# Patient Record
Sex: Female | Born: 1975 | Race: White | Hispanic: No | Marital: Married | Smoking: Never smoker
Health system: Southern US, Community
[De-identification: ages and names within clinical notes are randomized; demographics above are authoritative.]

## PROBLEM LIST (undated history)

## (undated) DIAGNOSIS — F419 Anxiety disorder, unspecified: Secondary | ICD-10-CM

## (undated) DIAGNOSIS — F32A Depression, unspecified: Secondary | ICD-10-CM

## (undated) DIAGNOSIS — I10 Essential (primary) hypertension: Secondary | ICD-10-CM

## (undated) DIAGNOSIS — Z8742 Personal history of other diseases of the female genital tract: Secondary | ICD-10-CM

## (undated) DIAGNOSIS — E063 Autoimmune thyroiditis: Secondary | ICD-10-CM

## (undated) DIAGNOSIS — K219 Gastro-esophageal reflux disease without esophagitis: Secondary | ICD-10-CM

## (undated) DIAGNOSIS — D494 Neoplasm of unspecified behavior of bladder: Secondary | ICD-10-CM

## (undated) DIAGNOSIS — K589 Irritable bowel syndrome without diarrhea: Secondary | ICD-10-CM

## (undated) DIAGNOSIS — Z973 Presence of spectacles and contact lenses: Secondary | ICD-10-CM

## (undated) DIAGNOSIS — F329 Major depressive disorder, single episode, unspecified: Secondary | ICD-10-CM

## (undated) HISTORY — DX: Anxiety disorder, unspecified: F41.9

## (undated) HISTORY — PX: NO PAST SURGERIES: SHX2092

## (undated) HISTORY — DX: Irritable bowel syndrome, unspecified: K58.9

## (undated) HISTORY — DX: Essential (primary) hypertension: I10

---

## 2006-06-26 ENCOUNTER — Inpatient Hospital Stay (HOSPITAL_COMMUNITY): Admission: AD | Admit: 2006-06-26 | Discharge: 2006-06-29 | Payer: Self-pay | Admitting: Obstetrics & Gynecology

## 2011-07-20 ENCOUNTER — Encounter: Payer: Self-pay | Admitting: Internal Medicine

## 2011-07-20 ENCOUNTER — Ambulatory Visit (INDEPENDENT_AMBULATORY_CARE_PROVIDER_SITE_OTHER): Payer: BC Managed Care – PPO | Admitting: Internal Medicine

## 2011-07-20 VITALS — BP 132/82 | HR 85 | Temp 97.6°F | Resp 16 | Ht 64.5 in | Wt 156.0 lb

## 2011-07-20 DIAGNOSIS — F411 Generalized anxiety disorder: Secondary | ICD-10-CM

## 2011-07-20 DIAGNOSIS — K589 Irritable bowel syndrome without diarrhea: Secondary | ICD-10-CM

## 2011-07-20 DIAGNOSIS — I10 Essential (primary) hypertension: Secondary | ICD-10-CM

## 2011-07-20 DIAGNOSIS — F419 Anxiety disorder, unspecified: Secondary | ICD-10-CM

## 2011-07-20 LAB — CBC WITH DIFFERENTIAL/PLATELET
Eosinophils Absolute: 0.2 10*3/uL (ref 0.0–0.7)
Hemoglobin: 15.2 g/dL — ABNORMAL HIGH (ref 12.0–15.0)
Lymphocytes Relative: 22 % (ref 12–46)
Lymphs Abs: 2.1 10*3/uL (ref 0.7–4.0)
Monocytes Relative: 9 % (ref 3–12)
Neutro Abs: 6.2 10*3/uL (ref 1.7–7.7)
Neutrophils Relative %: 67 % (ref 43–77)
Platelets: 304 10*3/uL (ref 150–400)
RBC: 5.1 MIL/uL (ref 3.87–5.11)
WBC: 9.4 10*3/uL (ref 4.0–10.5)

## 2011-07-20 LAB — COMPREHENSIVE METABOLIC PANEL
ALT: 31 U/L (ref 0–35)
Albumin: 4.7 g/dL (ref 3.5–5.2)
CO2: 24 mEq/L (ref 19–32)
Chloride: 100 mEq/L (ref 96–112)
Glucose, Bld: 78 mg/dL (ref 70–99)
Potassium: 3.6 mEq/L (ref 3.5–5.3)
Sodium: 138 mEq/L (ref 135–145)
Total Bilirubin: 0.5 mg/dL (ref 0.3–1.2)
Total Protein: 7 g/dL (ref 6.0–8.3)

## 2011-07-20 LAB — TSH: TSH: 1.099 u[IU]/mL (ref 0.350–4.500)

## 2011-07-20 MED ORDER — CLONAZEPAM 0.5 MG PO TABS: 0.2500 mg | ORAL_TABLET | Freq: Two times a day (BID) | ORAL | Status: DC | PRN

## 2011-07-20 NOTE — Patient Instructions (Signed)
Take klonopin 1 tab daily.  OK to try 2 times per day if needed .  Can repeat in 12 hours  See me in  3 months  Make appt with Dr. Evelene Croon or Dr. Nolen Mu and Jeannie Done therapist

## 2011-07-20 NOTE — Progress Notes (Signed)
Subjective:    Patient ID: Jacqueline Calhoun, female    DOB: 1975/11/15, 35 y.o.   MRN: [id number]  HPI New pt here for first visit.  Former pt of [facility] Dr. Kevan Ny.    PMH of anxiety,  IBS,  HTN, and abnormal pap.    Anxiety first diagnosed in 2004 when living in [place]  .  Came off meds when pregnant.  Saw Dr. Kevan Ny in 3/12 and was given buspar which made her nauseated and was changed to Klonipin which she does not take everyday.  Anxiety described and "twitchy, irritable, cannot sleep"  At times will feel tight in chest.  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  Strong family history of anxiety in fathers side.  Has felt a little depressed at times in the last 2 weeks.  No suicidal or homicidal ideation, no psychotic features.  She does not have a therapist now  Was also diagnosed with HTN in march .  Placed on HCTZ and losartan was added 2 weeks later.  Occasionally has heavy periods that Dr. Seymour Bars was treating.  Apparently could not tolerate OC's  No Known Allergies Past Medical History  Diagnosis Date  . IBS (irritable bowel syndrome)   . Anxiety   . Hypertension    History reviewed. No pertinent past surgical history. History   Social History  . Marital Status: Married    Spouse Name: N/A    Number of Children: N/A  . Years of Education: N/A   Occupational History  . Not on file.   Social History Main Topics  . Smoking status: Never Smoker   . Smokeless tobacco: Never Used  . Alcohol Use: 1.2 oz/week    2 Shots of liquor per week  . Drug Use: No  . Sexually Active: Yes    Birth Control/ Protection: Condom   Other Topics Concern  . Not on file   Social History Narrative  . No narrative on file   Family History  Problem Relation Age of Onset  . Hypertension Mother   . Hypertension Father   . Heart disease Maternal Grandmother   . Heart disease Maternal Grandfather    Patient Active Problem List  Diagnoses    . Anxiety  . IBS (irritable bowel syndrome)  . Hypertension   No current outpatient prescriptions on file prior to visit.         Review of Systems    see HPI Objective:   Physical Exam Physical Exam  Nursing note and vitals reviewed.  Constitutional: She is oriented to person, place, and time. She appears well-developed and well-nourished.  HENT:  Head: Normocephalic and atraumatic.  Cardiovascular: Normal rate and regular rhythm. Exam reveals no gallop and no friction rub.  No murmur heard.  Pulmonary/Chest: Breath sounds normal. She has no wheezes. She has no rales.  Neurological: She is alert and oriented to person, place, and time.  Skin: Skin is warm and dry.  Psychiatric: She has a normal mood and affect. Her behavior is normal.         Assessment & Plan:  1)  Anxiety  Not under control.  Counseled to take Klonipin 1/2 tab daily and can repeat in 12 hours if needed.  OK to go to 1 tab daily if symptoms not controlled.  Will check labs, TSH today Refer to Dr. Evelene Croon or Dr. Nolen Mu -  Pt has number to call   Also gave number to [facility]  [place] for talking therapy  2)  HTN  Under good control 3)  IBS per pt history 4)  Mennorhagia  Dr. Seymour Bars following .  Will check CBC  I spent 45 minutes with this pt

## 2011-07-26 ENCOUNTER — Encounter: Payer: Self-pay | Admitting: Emergency Medicine

## 2011-07-28 ENCOUNTER — Telehealth: Payer: Self-pay | Admitting: Internal Medicine

## 2011-07-28 NOTE — Telephone Encounter (Signed)
Left message on MD voicemail at [facility] with medication as rx'd by DDS at [facility] 07/20/11

## 2011-07-28 NOTE — Telephone Encounter (Signed)
Pt calling about getting a refill on ClonazePAM (Tab) KLONOPIN 0.5 MG Take 0.5 tablets (0.25 mg total) by mouth 2 (two) times daily as needed for anxiety. Her pharmacy is at [facility] in [place] [address]. She states to call her if you have any questions at [phone]

## 2011-09-28 ENCOUNTER — Ambulatory Visit: Payer: BC Managed Care – PPO | Admitting: Internal Medicine

## 2011-11-16 ENCOUNTER — Ambulatory Visit (INDEPENDENT_AMBULATORY_CARE_PROVIDER_SITE_OTHER): Payer: BC Managed Care – PPO | Admitting: Internal Medicine

## 2011-11-16 ENCOUNTER — Encounter: Payer: Self-pay | Admitting: Internal Medicine

## 2011-11-16 VITALS — BP 130/80 | HR 88 | Temp 98.5°F | Resp 10 | Ht 64.5 in | Wt 162.0 lb

## 2011-11-16 DIAGNOSIS — F411 Generalized anxiety disorder: Secondary | ICD-10-CM

## 2011-11-16 DIAGNOSIS — J309 Allergic rhinitis, unspecified: Secondary | ICD-10-CM

## 2011-11-16 DIAGNOSIS — N926 Irregular menstruation, unspecified: Secondary | ICD-10-CM | POA: Insufficient documentation

## 2011-11-16 DIAGNOSIS — F419 Anxiety disorder, unspecified: Secondary | ICD-10-CM

## 2011-11-16 MED ORDER — CLONAZEPAM 0.5 MG PO TABS: 0.2500 mg | ORAL_TABLET | Freq: Two times a day (BID) | ORAL | Status: DC | PRN

## 2011-11-16 NOTE — Patient Instructions (Signed)
See me in 3 months  Make appt with Dr. Evelene Croon or Dr. Nolen Mu

## 2011-11-16 NOTE — Progress Notes (Signed)
Subjective:    Patient ID: Jacqueline Calhoun, female    DOB: Mar 28, 1976, 36 y.o.   MRN: [id number]  HPI  Jacqueline Calhoun is here for follow up.  She states Klonopin ishelping quite a bit.  Can concentrate, still early morning awakening.  Upon further quesitoning she did state she felt "blue" after birth of her 36 year old.  She has not made an appt with psychiatrist as yet.  No suicidal or homicidal ideation.  Some days feels depressed at times.  Some sneezing and nose bleed lately.  Feels itchy like allergies.  Still has some irregualr bleeding but hesitant to go on OC's  She has upcoming appt with Dr. Seymour Calhoun  No Known Allergies Past Medical History  Diagnosis Date  . IBS (irritable bowel syndrome)   . Anxiety   . Hypertension    No past surgical history on file. History   Social History  . Marital Status: Married    Spouse Name: N/A    Number of Children: N/A  . Years of Education: N/A   Occupational History  . Not on file.   Social History Main Topics  . Smoking status: Never Smoker   . Smokeless tobacco: Never Used  . Alcohol Use: 1.2 oz/week    2 Shots of liquor per week  . Drug Use: No  . Sexually Active: Yes    Birth Control/ Protection: Condom   Other Topics Concern  . Not on file   Social History Narrative  . No narrative on file   Family History  Problem Relation Age of Onset  . Hypertension Mother   . Hypertension Father   . Heart disease Maternal Grandmother   . Heart disease Maternal Grandfather    Patient Active Problem List  Diagnoses  . Anxiety  . IBS (irritable bowel syndrome)  . Hypertension   Current Outpatient Prescriptions on File Prior to Visit  Medication Sig Dispense Refill  . b complex vitamins tablet Take 1 tablet by mouth daily.        . calcium-vitamin D (OSCAL WITH D) 250-125 MG-UNIT per tablet Take 1 tablet by mouth daily.        . clonazePAM (KLONOPIN) 0.5 MG tablet Take 0.5 tablets (0.25 mg total) by mouth 2 (two) times daily as needed for  anxiety.  30 tablet  1  . hydrochlorothiazide (HYDRODIURIL) 25 MG tablet Take 1 tablet by mouth Daily.      Marland Kitchen losartan (COZAAR) 25 MG tablet Take 1 tablet by mouth Daily.      . Magnesium 250 MG TABS Take 1 tablet by mouth daily.        . Multiple Vitamin (MULTIVITAMIN) tablet Take 1 tablet by mouth daily.        . Probiotic Product (PROBIOTIC ACIDOPHILUS PO) Take 1 tablet by mouth daily.            Review of Systems    see HPI Objective:   Physical Exam   Physical Exam  Nursing note and vitals reviewed.  Constitutional: She is oriented to person, place, and time. She appears well-developed and well-nourished.  HENT:  Head: Normocephalic and atraumatic.  Cardiovascular: Normal rate and regular rhythm. Exam reveals no gallop and no friction rub.  No murmur heard.  Pulmonary/Chest: Breath sounds normal. She has no wheezes. She has no rales.  Neurological: She is alert and oriented to person, place, and time.  Skin: Skin is warm and dry.  Psychiatric: She has a normal mood and affect. Her behavior  is normal.            Assessment & Plan:  1)  Anxiety vs. 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  ADvised I will only give 2 more months of Klonopin and she is to make appt with psychiatrist .  She voices understanding 2)  Allergic rhinitis  Now that BP well conrolled ok to take Claritin or cClaritin D 3)  htn  Well controlled 4)  Irregular menses to discuss with Dr. Seymour Calhoun

## 2012-02-07 ENCOUNTER — Ambulatory Visit: Payer: BC Managed Care – PPO | Admitting: Internal Medicine

## 2012-02-14 ENCOUNTER — Ambulatory Visit: Payer: BC Managed Care – PPO | Admitting: Internal Medicine

## 2012-02-14 ENCOUNTER — Telehealth: Payer: Self-pay | Admitting: Internal Medicine

## 2012-02-29 ENCOUNTER — Ambulatory Visit: Payer: BC Managed Care – PPO | Admitting: Internal Medicine

## 2012-03-01 ENCOUNTER — Encounter: Payer: Self-pay | Admitting: Internal Medicine

## 2012-03-01 ENCOUNTER — Ambulatory Visit (INDEPENDENT_AMBULATORY_CARE_PROVIDER_SITE_OTHER): Payer: BC Managed Care – PPO | Admitting: Internal Medicine

## 2012-03-01 VITALS — BP 124/69 | HR 76 | Temp 98.2°F | Resp 16 | Ht 64.5 in | Wt 161.0 lb

## 2012-03-01 DIAGNOSIS — F419 Anxiety disorder, unspecified: Secondary | ICD-10-CM

## 2012-03-01 DIAGNOSIS — F411 Generalized anxiety disorder: Secondary | ICD-10-CM

## 2012-03-01 DIAGNOSIS — H6092 Unspecified otitis externa, left ear: Secondary | ICD-10-CM

## 2012-03-01 DIAGNOSIS — H60399 Other infective otitis externa, unspecified ear: Secondary | ICD-10-CM

## 2012-03-01 MED ORDER — OFLOXACIN 0.3 % OT SOLN: 5.0000 [drp] | Freq: Every day | OTIC | Status: AC

## 2012-03-03 ENCOUNTER — Encounter: Payer: Self-pay | Admitting: Internal Medicine

## 2012-03-03 DIAGNOSIS — H6092 Unspecified otitis externa, left ear: Secondary | ICD-10-CM | POA: Insufficient documentation

## 2012-03-03 NOTE — Progress Notes (Signed)
Subjective:    Patient ID: Jacqueline Calhoun, female    DOB: [birth year], 36 y.o.   MRN: [id number]  HPI Labrittany is here for follow up and has a new problem with her L ear.    She did see Dr. Evelene Croon and was placed on Prozac.  Kawana describes that Dr. Evelene Croon may have felt there was an OCD component to her recent behavior as Ciana was self harming by compulsively scratching her arms until they bled.   Since she has been on Prozac last 2-3 weeks, scratching behavior has ceased.  Tolerating meds well  She has pain in L ear, no fever no drainage.  She has been in the water lately  No Known Allergies Past Medical History  Diagnosis Date  . IBS (irritable bowel syndrome)   . Anxiety   . Hypertension    History reviewed. No pertinent past surgical history. History   Social History  . Marital Status: Married    Spouse Name: N/A    Number of Children: N/A  . Years of Education: N/A   Occupational History  . Not on file.   Social History Main Topics  . Smoking status: Never Smoker   . Smokeless tobacco: Never Used  . Alcohol Use: 1.2 oz/week    2 Shots of liquor per week  . Drug Use: No  . Sexually Active: Yes    Birth Control/ Protection: Condom   Other Topics Concern  . Not on file   Social History Narrative  . No narrative on file   Family History  Problem Relation Age of Onset  . Hypertension Mother   . Hypertension Father   . Heart disease Maternal Grandmother   . Heart disease Maternal Grandfather    Patient Active Problem List  Diagnosis  . Anxiety  . IBS (irritable bowel syndrome)  . Hypertension  . Allergic rhinitis  . Irregular menses   Current Outpatient Prescriptions on File Prior to Visit  Medication Sig Dispense Refill  . b complex vitamins tablet Take 1 tablet by mouth daily.        . calcium-vitamin D (OSCAL WITH D) 250-125 MG-UNIT per tablet Take 1 tablet by mouth daily.        . clonazePAM (KLONOPIN) 0.5 MG tablet Take 0.5 tablets (0.25 mg total) by mouth 2  (two) times daily as needed for anxiety.  30 tablet  1  . hydrochlorothiazide (HYDRODIURIL) 25 MG tablet Take 1 tablet by mouth Daily.      Marland Kitchen losartan (COZAAR) 25 MG tablet Take 1 tablet by mouth Daily.      . Magnesium 250 MG TABS Take 1 tablet by mouth daily.        . Multiple Vitamin (MULTIVITAMIN) tablet Take 1 tablet by mouth daily.        . Probiotic Product (PROBIOTIC ACIDOPHILUS PO) Take 1 tablet by mouth daily.        Marland Kitchen FLUoxetine (PROZAC) 10 MG capsule            Review of Systems    see HPI Objective:   Physical Exam  Physical Exam  Nursing note and vitals reviewed.  Constitutional: She is oriented to person, place, and time. She appears well-developed and well-nourished.  HENT:  Head: Normocephalic and atraumatic. TM's normal bilaterally.  R ear canal normal.  L canal red and swollen Cardiovascular: Normal rate and regular rhythm. Exam reveals no gallop and no friction rub.  No murmur heard.  Pulmonary/Chest: Breath sounds normal. She has  no wheezes. She has no rales.  Neurological: She is alert and oriented to person, place, and time.  Skin: Skin is warm and dry.  Psychiatric: She has a normal mood and affect. Her behavior is normal.         Assessment & Plan:  Anxiety/possible OCD  Continue with Dr. Evelene Croon, Prozac helping  L otiits externa.    Will give floxin otic   Return as needed

## 2012-03-23 ENCOUNTER — Other Ambulatory Visit: Payer: Self-pay | Admitting: Internal Medicine

## 2012-03-23 MED ORDER — LOSARTAN POTASSIUM 25 MG PO TABS: 25.0000 mg | ORAL_TABLET | Freq: Every day | ORAL | Status: DC

## 2012-03-23 MED ORDER — HYDROCHLOROTHIAZIDE 25 MG PO TABS: 25.0000 mg | ORAL_TABLET | Freq: Every day | ORAL | Status: DC

## 2012-03-23 NOTE — Telephone Encounter (Signed)
Pt need refills on Losartan Potassium (Tab) COZAAR 25 MG Take 1 tablet by mouth Daily     Hydrochlorothiazide (Tab) HYDRODIURIL 25 MG Take 1 tablet by mouth Daily.   She states she needs it sent to [facility] @ [place] off [address] If there are any questions please call pt at [phone]... Ad

## 2012-04-03 ENCOUNTER — Other Ambulatory Visit: Payer: Self-pay | Admitting: *Deleted

## 2012-04-03 DIAGNOSIS — I10 Essential (primary) hypertension: Secondary | ICD-10-CM

## 2012-04-03 MED ORDER — LOSARTAN POTASSIUM 25 MG PO TABS: 25.0000 mg | ORAL_TABLET | Freq: Every day | ORAL | Status: DC

## 2012-04-03 NOTE — Telephone Encounter (Signed)
[facility] faxed over RF authorization for pt's Losartan 25 mg.  RF granted per Dr Constance Goltz

## 2012-06-26 ENCOUNTER — Encounter: Payer: Self-pay | Admitting: Internal Medicine

## 2012-06-26 ENCOUNTER — Ambulatory Visit (INDEPENDENT_AMBULATORY_CARE_PROVIDER_SITE_OTHER): Payer: BC Managed Care – PPO | Admitting: Internal Medicine

## 2012-06-26 VITALS — BP 118/82 | HR 78 | Temp 97.6°F | Resp 18 | Wt 169.0 lb

## 2012-06-26 DIAGNOSIS — Z23 Encounter for immunization: Secondary | ICD-10-CM

## 2012-06-26 NOTE — Progress Notes (Signed)
  Subjective:    Patient ID: Jacqueline Calhoun, female    DOB: 12-11-1975, 36 y.o.   MRN: [id number]  HPI Error  Nurse visit for flu shot   Review of Systems     Objective:   Physical Exam        Assessment & Plan:

## 2012-07-06 ENCOUNTER — Ambulatory Visit (INDEPENDENT_AMBULATORY_CARE_PROVIDER_SITE_OTHER): Payer: BC Managed Care – PPO | Admitting: Physician Assistant

## 2012-07-06 VITALS — BP 122/70 | HR 67 | Temp 98.6°F | Resp 16 | Ht 64.75 in | Wt 172.0 lb

## 2012-07-06 DIAGNOSIS — J069 Acute upper respiratory infection, unspecified: Secondary | ICD-10-CM

## 2012-07-06 DIAGNOSIS — J029 Acute pharyngitis, unspecified: Secondary | ICD-10-CM

## 2012-07-06 LAB — POCT RAPID STREP A (OFFICE): Rapid Strep A Screen: NEGATIVE

## 2012-07-06 MED ORDER — FLUTICASONE PROPIONATE 50 MCG/ACT NA SUSP: 2.0000 | Freq: Every day | NASAL | Status: DC

## 2012-07-06 NOTE — Progress Notes (Signed)
   [address], [place] [address]   Phone [phone]  Subjective:    Patient ID: Jacqueline Calhoun, female    DOB: [birth year], 36 y.o.   MRN: [id number]  HPI Pt presents to clinic with 5 days h/o sore throat.  It has changed sides, it started on the L side and now on the R side.  Today she has developed some sinus congestion and HA.  She does not feel like she has PND.  She has seasonal allergies but this feels different b/c sore throat from PND typically goes away whereas this is not getting better.  She has used some tylenol which helps some.   Review of Systems  Constitutional: Negative for fever and chills.  HENT: Positive for congestion and sore throat. Negative for rhinorrhea and postnasal drip.   Respiratory: Negative for cough.   Neurological: Positive for headaches.       Objective:   Physical Exam  Vitals reviewed. Constitutional: She is oriented to person, place, and time. She appears well-developed and well-nourished.  HENT:  Head: Normocephalic and atraumatic.  Right Ear: Hearing, tympanic membrane, external ear and ear canal normal.  Left Ear: Hearing, tympanic membrane, external ear and ear canal normal.  Nose: Mucosal edema (pale) present.  Mouth/Throat: Uvula is midline.  Eyes: Conjunctivae normal are normal.  Neck: Neck supple.  Cardiovascular: Normal rate, regular rhythm and normal heart sounds.   No murmur heard. Pulmonary/Chest: Effort normal and breath sounds normal.  Lymphadenopathy:    She has no cervical adenopathy.  Neurological: She is alert and oriented to person, place, and time.  Skin: Skin is warm and dry.  Psychiatric: She has a normal mood and affect. Her behavior is normal. Judgment and thought content normal.   Results for orders placed in visit on 07/06/12  POCT RAPID STREP A (OFFICE)      Component Value Range   Rapid Strep A Screen Negative  Negative          Assessment & Plan:   1. Acute pharyngitis  POCT rapid strep A  2. URI  (upper respiratory infection)  fluticasone (FLONASE) 50 MCG/ACT nasal spray   I believe this is a viral illness with some PND from possible allergies due to pale and boggy turbinates.  I unfortunately expect pt to start having some rhinorrhea due to her congestion that started today.

## 2012-07-06 NOTE — Patient Instructions (Addendum)
Push fluids.  Tylenol and motrin for pain.  Use mucinex for congestion.

## 2012-07-24 ENCOUNTER — Other Ambulatory Visit: Payer: Self-pay | Admitting: *Deleted

## 2012-07-24 DIAGNOSIS — I10 Essential (primary) hypertension: Secondary | ICD-10-CM

## 2012-07-24 MED ORDER — HYDROCHLOROTHIAZIDE 25 MG PO TABS: 25.0000 mg | ORAL_TABLET | Freq: Every day | ORAL | Status: DC

## 2012-07-24 MED ORDER — LOSARTAN POTASSIUM 25 MG PO TABS: 25.0000 mg | ORAL_TABLET | Freq: Every day | ORAL | Status: DC

## 2012-07-24 NOTE — Telephone Encounter (Signed)
Jacqueline Calhoun  Call pt and let her know that I refilled 30 days worth of her BP meds.  She needs to see me in office and I need to recheck her labs on these meds.   Last labs done one year ago  thanks

## 2012-07-24 NOTE — Telephone Encounter (Signed)
Needs to be refilled 

## 2012-07-25 NOTE — Telephone Encounter (Signed)
Left message regarding [facility] and lab work

## 2012-08-01 ENCOUNTER — Telehealth: Payer: Self-pay | Admitting: *Deleted

## 2012-08-01 NOTE — Telephone Encounter (Signed)
Called pharmacy with rx HCTZ

## 2012-08-14 ENCOUNTER — Encounter (INDEPENDENT_AMBULATORY_CARE_PROVIDER_SITE_OTHER): Payer: BC Managed Care – PPO | Admitting: Internal Medicine

## 2012-08-14 LAB — LIPID PANEL
Cholesterol: 192 mg/dL (ref 0–200)
LDL Cholesterol: 113 mg/dL — ABNORMAL HIGH (ref 0–99)
Total CHOL/HDL Ratio: 3.4 Ratio
VLDL: 22 mg/dL (ref 0–40)

## 2012-08-14 LAB — COMPREHENSIVE METABOLIC PANEL
ALT: 25 U/L (ref 0–35)
AST: 22 U/L (ref 0–37)
Alkaline Phosphatase: 70 U/L (ref 39–117)
Creat: 0.83 mg/dL (ref 0.50–1.10)
Total Bilirubin: 0.5 mg/dL (ref 0.3–1.2)

## 2012-08-15 ENCOUNTER — Encounter: Payer: Self-pay | Admitting: *Deleted

## 2012-08-17 NOTE — Progress Notes (Signed)
  Subjective:    Patient ID: Jacqueline Calhoun, female    DOB: 11-Jun-1976, 36 y.o.   MRN: [id number]  HPI  Erroneous   Review of Systems     Objective:   Physical Exam        Assessment & Plan:

## 2012-08-24 ENCOUNTER — Encounter: Payer: Self-pay | Admitting: *Deleted

## 2012-09-29 ENCOUNTER — Other Ambulatory Visit: Payer: Self-pay | Admitting: Internal Medicine

## 2012-09-29 NOTE — Telephone Encounter (Signed)
Refill request

## 2012-09-30 MED ORDER — POTASSIUM CHLORIDE CRYS ER 20 MEQ PO TBCR: 20.0000 meq | EXTENDED_RELEASE_TABLET | Freq: Every day | ORAL | Status: DC

## 2012-09-30 NOTE — Telephone Encounter (Signed)
Jacqueline Calhoun  Call pt and let her know that the HCTZ she is on lowers her K level.  At her last lab check her K was at the lower end of normal range. I ordered two months of her HCTZ and a new K-dur RX  Counsel her to be sure to take a K Pill everyday with her HCTZ  And see me in office withing the next 60 days.     Message back with her appt  Date  Thanks

## 2012-10-30 ENCOUNTER — Ambulatory Visit (INDEPENDENT_AMBULATORY_CARE_PROVIDER_SITE_OTHER): Payer: BC Managed Care – PPO | Admitting: Internal Medicine

## 2012-10-30 ENCOUNTER — Encounter: Payer: Self-pay | Admitting: Internal Medicine

## 2012-10-30 VITALS — BP 112/74 | HR 68 | Temp 97.9°F | Resp 16 | Ht 65.0 in | Wt 171.0 lb

## 2012-10-30 DIAGNOSIS — I10 Essential (primary) hypertension: Secondary | ICD-10-CM

## 2012-10-30 DIAGNOSIS — R5383 Other fatigue: Secondary | ICD-10-CM

## 2012-10-30 DIAGNOSIS — F329 Major depressive disorder, single episode, unspecified: Secondary | ICD-10-CM

## 2012-10-30 DIAGNOSIS — R5381 Other malaise: Secondary | ICD-10-CM

## 2012-10-30 LAB — T4, FREE: Free T4: 1.13 ng/dL (ref 0.80–1.80)

## 2012-10-30 LAB — TSH: TSH: 1.353 u[IU]/mL (ref 0.350–4.500)

## 2012-10-30 NOTE — Patient Instructions (Addendum)
Call psychiatrist for appt  See me if any worsening symptoms

## 2012-10-30 NOTE — Progress Notes (Signed)
Subjective:    Patient ID: Jacqueline Calhoun, female    DOB: 09/20/1975, 37 y.o.   MRN: [id number]  HPI  [name] is here for acute visit. She is wondering if she has low thyroid.  Most female family members have low thyroid.  She has some weight gain no dry skin no hair loss  She also reports she stopped taking her Prozac as she did not like it.  She has not been back to see Dr. Evelene Croon but she does see Jerral Bonito her therapist regularly.  She describes herself as "stuck" emotionally. Wants to sleep a lot , feels tired all the time,  Depressed mood at times.  She denies S/H ideation  Just feels stuck.  She does not want to continue with Dr. Evelene Croon and was given a name of a psychaitrist by her therapist and she plans to call him.  Could not recall name.  She takes her Klonopin as needed only not regularly   No Known Allergies Past Medical History  Diagnosis Date  . IBS (irritable bowel syndrome)   . Anxiety   . Hypertension    History reviewed. No pertinent past surgical history. History   Social History  . Marital Status: Married    Spouse Name: N/A    Number of Children: N/A  . Years of Education: N/A   Occupational History  . Not on file.   Social History Main Topics  . Smoking status: Never Smoker   . Smokeless tobacco: Never Used  . Alcohol Use: 1.2 oz/week    2 Shots of liquor per week  . Drug Use: No  . Sexually Active: Yes    Birth Control/ Protection: Condom   Other Topics Concern  . Not on file   Social History Narrative  . No narrative on file   Family History  Problem Relation Age of Onset  . Hypertension Mother   . Hypertension Father   . Anxiety disorder Father   . Heart disease Maternal Grandmother   . Heart disease Maternal Grandfather    Patient Active Problem List  Diagnosis  . Anxiety  . IBS (irritable bowel syndrome)  . Hypertension  . Allergic rhinitis  . Irregular menses  . Left otitis externa  . Depression   Current Outpatient Prescriptions on  File Prior to Visit  Medication Sig Dispense Refill  . clonazePAM (KLONOPIN) 0.5 MG tablet Take 0.5 tablets (0.25 mg total) by mouth 2 (two) times daily as needed for anxiety.  30 tablet  1  . hydrochlorothiazide (HYDRODIURIL) 25 MG tablet TAKE 1 TABLET BY MOUTH DAILY  30 tablet  1  . losartan (COZAAR) 25 MG tablet Take 1 tablet (25 mg total) by mouth daily.  30 tablet  0  . Multiple Vitamin (MULTIVITAMIN) tablet Take 1 tablet by mouth daily.        . Probiotic Product (PROBIOTIC ACIDOPHILUS PO) Take 1 tablet by mouth daily.        . calcium-vitamin D (OSCAL WITH D) 250-125 MG-UNIT per tablet Take 1 tablet by mouth daily.        . fluticasone (FLONASE) 50 MCG/ACT nasal spray Place 2 sprays into the nose daily.  16 g  1  . Magnesium 250 MG TABS Take 1 tablet by mouth daily.        . potassium chloride SA (K-DUR,KLOR-CON) 20 MEQ tablet Take 1 tablet (20 mEq total) by mouth daily.  30 tablet  1   No current facility-administered medications on file prior to  visit.     Review of Systems    see HPI Objective:   Physical Exam Physical Exam  Nursing note and vitals reviewed.  Constitutional: She is oriented to person, place, and time. She appears well-developed and well-nourished.  HENT:  Head: Normocephalic and atraumatic.  Cardiovascular: Normal rate and regular rhythm. Exam reveals no gallop and no friction rub.  No murmur heard.  Pulmonary/Chest: Breath sounds normal. She has no wheezes. She has no rales.  Neurological: She is alert and oriented to person, place, and time.  Skin: Skin is warm and dry.  Psychiatric: She has a normal mood and affect. Her behavior is normal.             Assessment & Plan:  Anxiety/Depression:  I advised pt to try Wellbutrin as she does not like the SSRI's/SNRI's.  She declines at this time .  Encouraged to call psychiatrist for appt which she states she will do.  Continue with therapist Jerral Bonito.  Also counseled if worsening depression/anxiety  to either see me in office or  go to [facility] ER of choice and ask for behavioral assessment.    She is to see me in 4 weeks  HTN  Continue meds  Fatigue/weight gain   Will check TSH and free levels

## 2012-10-31 ENCOUNTER — Encounter: Payer: Self-pay | Admitting: *Deleted

## 2012-12-25 ENCOUNTER — Other Ambulatory Visit: Payer: Self-pay | Admitting: Internal Medicine

## 2012-12-25 NOTE — Telephone Encounter (Signed)
Refill request

## 2013-04-06 ENCOUNTER — Emergency Department (HOSPITAL_BASED_OUTPATIENT_CLINIC_OR_DEPARTMENT_OTHER): Payer: BC Managed Care – PPO

## 2013-04-06 ENCOUNTER — Emergency Department (HOSPITAL_BASED_OUTPATIENT_CLINIC_OR_DEPARTMENT_OTHER)
Admission: EM | Admit: 2013-04-06 | Discharge: 2013-04-06 | Disposition: A | Discharge status: Home / Self Care | Payer: BC Managed Care – PPO | Attending: Emergency Medicine | Admitting: Emergency Medicine

## 2013-04-06 ENCOUNTER — Encounter (HOSPITAL_BASED_OUTPATIENT_CLINIC_OR_DEPARTMENT_OTHER): Payer: Self-pay | Admitting: Family Medicine

## 2013-04-06 DIAGNOSIS — I10 Essential (primary) hypertension: Secondary | ICD-10-CM | POA: Insufficient documentation

## 2013-04-06 DIAGNOSIS — N83209 Unspecified ovarian cyst, unspecified side: Secondary | ICD-10-CM | POA: Insufficient documentation

## 2013-04-06 DIAGNOSIS — R110 Nausea: Secondary | ICD-10-CM | POA: Insufficient documentation

## 2013-04-06 DIAGNOSIS — R61 Generalized hyperhidrosis: Secondary | ICD-10-CM | POA: Insufficient documentation

## 2013-04-06 DIAGNOSIS — Z3202 Encounter for pregnancy test, result negative: Secondary | ICD-10-CM | POA: Insufficient documentation

## 2013-04-06 DIAGNOSIS — K589 Irritable bowel syndrome without diarrhea: Secondary | ICD-10-CM | POA: Insufficient documentation

## 2013-04-06 DIAGNOSIS — F411 Generalized anxiety disorder: Secondary | ICD-10-CM | POA: Insufficient documentation

## 2013-04-06 DIAGNOSIS — Z79899 Other long term (current) drug therapy: Secondary | ICD-10-CM | POA: Insufficient documentation

## 2013-04-06 LAB — CBC WITH DIFFERENTIAL/PLATELET
Hemoglobin: 14.5 g/dL (ref 12.0–15.0)
Lymphocytes Relative: 13 % (ref 12–46)
Lymphs Abs: 1.8 10*3/uL (ref 0.7–4.0)
Monocytes Relative: 7 % (ref 3–12)
Neutro Abs: 10.8 10*3/uL — ABNORMAL HIGH (ref 1.7–7.7)
Neutrophils Relative %: 79 % — ABNORMAL HIGH (ref 43–77)
Platelets: 273 10*3/uL (ref 150–400)
RBC: 4.96 MIL/uL (ref 3.87–5.11)
WBC: 13.7 10*3/uL — ABNORMAL HIGH (ref 4.0–10.5)

## 2013-04-06 LAB — COMPREHENSIVE METABOLIC PANEL
ALT: 35 U/L (ref 0–35)
Alkaline Phosphatase: 64 U/L (ref 39–117)
BUN: 11 mg/dL (ref 6–23)
Chloride: 98 mEq/L (ref 96–112)
GFR calc Af Amer: >90 mL/min (ref >90)
Glucose, Bld: 87 mg/dL (ref 70–99)
Potassium: 2.8 mEq/L — ABNORMAL LOW (ref 3.5–5.1)
Sodium: 138 mEq/L (ref 135–145)
Total Bilirubin: 0.5 mg/dL (ref 0.3–1.2)

## 2013-04-06 LAB — URINALYSIS, ROUTINE W REFLEX MICROSCOPIC
Glucose, UA: NEGATIVE mg/dL
Ketones, ur: NEGATIVE mg/dL
Specific Gravity, Urine: 1.015 (ref 1.005–1.030)
pH: 7.5 (ref 5.0–8.0)

## 2013-04-06 LAB — URINE MICROSCOPIC-ADD ON

## 2013-04-06 LAB — PREGNANCY, URINE: Preg Test, Ur: NEGATIVE

## 2013-04-06 LAB — LIPASE, BLOOD: Lipase: 42 U/L (ref 11–59)

## 2013-04-06 MED ORDER — IOHEXOL 300 MG/ML  SOLN
50.0000 mL | Freq: Once | INTRAMUSCULAR | Status: AC | PRN
  Administered 2013-04-06: 50 mL via ORAL

## 2013-04-06 MED ORDER — SODIUM CHLORIDE 0.9 % IV BOLUS (SEPSIS)
1000.0000 mL | Freq: Once | INTRAVENOUS | Status: AC
  Administered 2013-04-06: 1000 mL via INTRAVENOUS

## 2013-04-06 MED ORDER — ACETAMINOPHEN-CODEINE #3 300-30 MG PO TABS: 1.0000 | ORAL_TABLET | Freq: Four times a day (QID) | ORAL | Status: DC | PRN

## 2013-04-06 MED ORDER — IOHEXOL 300 MG/ML  SOLN
100.0000 mL | Freq: Once | INTRAMUSCULAR | Status: AC | PRN
  Administered 2013-04-06: 100 mL via INTRAVENOUS

## 2013-04-06 MED ORDER — POTASSIUM CHLORIDE CRYS ER 20 MEQ PO TBCR
40.0000 meq | EXTENDED_RELEASE_TABLET | Freq: Once | ORAL | Status: AC
  Administered 2013-04-06: 40 meq via ORAL
  Filled 2013-04-06: qty 2

## 2013-04-06 NOTE — ED Notes (Signed)
Flushed left AC IV with 10ml NS per patient request - IV was irritating her arm.  Pt states IV feels "better" after NS flush.  Dressing clean, dry, intact and no signs of infection noted at site.  Awaiting U/S study results and MD reevaluation.

## 2013-04-06 NOTE — ED Notes (Signed)
Pt c/o lower abdominal pain since this morning. Reports normal bowel movement today, c/o some nausea no vomiting. Pt sts she has had negative [place] at [facility] for abdomen pain recently but sts today's pain is different.

## 2013-04-06 NOTE — ED Provider Notes (Signed)
CSN: [id number]     Arrival date & time 04/06/13  1227 History     First MD Initiated Contact with Patient 04/06/13 1240     Chief Complaint  Patient presents with  . Abdominal Pain   (Consider location/radiation/quality/duration/timing/severity/associated sxs/prior Treatment) Patient is a 37 y.o. female presenting with abdominal pain.  Abdominal Pain  Pt with recent history of intermittent L sided abdominal and pelvic pain reports she has had moderate aching R lower abdominal pain since having BM earlier this morning associated with nausea, diaphoresis but no vomiting or fever. No diarrhea. No dysuria or hematuria. She has pelvic US about 2 weeks ago due to her left sided symptoms looking for fibroids or ovarian cysts but by her report it was neg. She has not had any right sided symptoms until today. She states her Gyn told her the next step would be laparoscopy to eval endometriosis. Pt has not had any vaginal bleeding or discharge.  Past Medical History  Diagnosis Date  . IBS (irritable bowel syndrome)   . Anxiety   . Hypertension    History reviewed. No pertinent past surgical history. Family History  Problem Relation Age of Onset  . Hypertension Mother   . Hypertension Father   . Anxiety disorder Father   . Heart disease Maternal Grandmother   . Heart disease Maternal Grandfather    History  Substance Use Topics  . Smoking status: Never Smoker   . Smokeless tobacco: Never Used  . Alcohol Use: 1.2 oz/week    2 Shots of liquor per week   OB History   Grav Para Term Preterm Abortions TAB SAB Ect Mult Living   1 1             Review of Systems  Gastrointestinal: Positive for abdominal pain.   All other systems reviewed and are negative except as noted in HPI.   Allergies  Review of patient's allergies indicates no known allergies.  Home Medications   Current Outpatient Rx  Name  Route  Sig  Dispense  Refill  . calcium-vitamin D (OSCAL WITH D) 250-125 MG-UNIT per  tablet   Oral   Take 1 tablet by mouth daily.           . clonazePAM (KLONOPIN) 0.5 MG tablet   Oral   Take 0.5 tablets (0.25 mg total) by mouth 2 (two) times daily as needed for anxiety.   30 tablet   1   . fluticasone (FLONASE) 50 MCG/ACT nasal spray   Nasal   Place 2 sprays into the nose daily.   16 g   1   . hydrochlorothiazide (HYDRODIURIL) 25 MG tablet      TAKE 1 TABLET BY MOUTH EVERY DAY   30 tablet   2   . losartan (COZAAR) 25 MG tablet   Oral   Take 1 tablet (25 mg total) by mouth daily.   30 tablet   0   . Magnesium 250 MG TABS   Oral   Take 1 tablet by mouth daily.           . Multiple Vitamin (MULTIVITAMIN) tablet   Oral   Take 1 tablet by mouth daily.           . potassium chloride SA (K-DUR,KLOR-CON) 20 MEQ tablet   Oral   Take 1 tablet (20 mEq total) by mouth daily.   30 tablet   1   . Probiotic Product (PROBIOTIC ACIDOPHILUS PO)   Oral  Take 1 tablet by mouth daily.            BP 152/97  Pulse 99  Temp(Src) 98.6 F (37 C) (Oral)  Resp 20  Ht 5\' 5"  (1.651 m)  Wt 170 lb (77.111 kg)  BMI 28.29 kg/m2  SpO2 99%  LMP 03/21/2013 Physical Exam  Nursing note and vitals reviewed. Constitutional: She is oriented to person, place, and time. She appears well-developed and well-nourished.  HENT:  Head: Normocephalic and atraumatic.  Eyes: EOM are normal. Pupils are equal, round, and reactive to light.  Neck: Normal range of motion. Neck supple.  Cardiovascular: Normal rate, normal heart sounds and intact distal pulses.   Pulmonary/Chest: Effort normal and breath sounds normal.  Abdominal: Bowel sounds are normal. She exhibits no distension. There is tenderness (RLQ). There is no rebound and no guarding.  Musculoskeletal: Normal range of motion. She exhibits no edema and no tenderness.  Neurological: She is alert and oriented to person, place, and time. She has normal strength. No cranial nerve deficit or sensory deficit.  Skin: Skin is  warm and dry. No rash noted.  Psychiatric: She has a normal mood and affect.    ED Course   Procedures (including critical care time)  Labs Reviewed  URINALYSIS, ROUTINE W REFLEX MICROSCOPIC - Abnormal; Notable for the following:    APPearance CLOUDY (*)    Leukocytes, UA MODERATE (*)    All other components within normal limits  CBC WITH DIFFERENTIAL - Abnormal; Notable for the following:    WBC 13.7 (*)    Neutrophils Relative % 79 (*)    Neutro Abs 10.8 (*)    All other components within normal limits  COMPREHENSIVE METABOLIC PANEL - Abnormal; Notable for the following:    Potassium 2.8 (*)    All other components within normal limits  URINE MICROSCOPIC-ADD ON - Abnormal; Notable for the following:    Squamous Epithelial / LPF MANY (*)    Bacteria, UA FEW (*)    All other components within normal limits  URINE CULTURE  PREGNANCY, URINE  LIPASE, BLOOD   Ct Abdomen Pelvis W Contrast  04/06/2013   *RADIOLOGY REPORT*  Clinical Data: Right lower quadrant pain  CT ABDOMEN AND PELVIS WITH CONTRAST  Technique:  Multidetector CT imaging of the abdomen and pelvis was performed following the standard protocol during bolus administration of intravenous contrast.  Contrast: 50mL OMNIPAQUE IOHEXOL 300 MG/ML  SOLN, OMNIPAQUE IOHEXOL 300 MG/ML  SOLN  Comparison: None.  Findings: The cecum is floppy and positioned in the right upper quadrant.  The appendix is within normal limits and indicated with arrows.  Prominent stool burden in the descending and sigmoid colon.  There is a small amount of free fluid layering in the pelvis.  Left adnexa is unremarkable.  There is a complex 4.6 x 2.5 cm solid and cystic abnormality within the right adnexa.  The bladder and prostate are within normal limits.  The thoracolumbar and lumbosacral junctions are transitional. Degenerative disc disease in the lower lumbar spine is present.  4 mm nodule in the lingula on image two.  Severe diffuse hepatic steatosis.   The gallbladder, spleen, pancreas, adrenal glands, kidneys are within normal limits.  No free intraperitoneal gas.  No obvious retroperitoneal adenopathy.  IMPRESSION: Normal appendix.  Complex solid and cystic abnormality in the right adnexa.  This may simply represent a large cystic abnormality of the ovary.  Other conditions such as hydrosalpinx should be considered.  Pelvic ultrasound  is recommended to further characterize.  Prominent stool burden in the distal colon.  Small amount of free fluid in the pelvis  4 mm left upper lobe nodule. If the patient is at high risk for bronchogenic carcinoma, follow-up chest CT at 1 year is recommended.  If the patient is at low risk, no follow-up is needed.  This recommendation follows the consensus statement: Guidelines for Management of Small Pulmonary Nodules Detected on CT Scans:  A Statement from the Fleischner Society as published in Radiology 2005; 237:395-400.  Degenerative disc disease in the lower lumbar spine.   Original Report Authenticated By: Jolaine Click, M.D.   No diagnosis found.  MDM  CT images reviewed. Sent for [place]. Care signed out to Dr. Rhunette Croft at shift change  Leonette Most B. Bernette Mayers, MD 04/06/13 [phone]

## 2013-04-07 LAB — URINE CULTURE: Colony Count: 40000

## 2013-04-09 ENCOUNTER — Telehealth: Payer: Self-pay | Admitting: Internal Medicine

## 2013-04-09 NOTE — Telephone Encounter (Signed)
Jacqueline Calhoun  Call this pt and ask if she The ER MD referrred her to her GYN after her ER visit.  If she was not referred , schedule OV with me  30 mins  thanks

## 2013-04-10 NOTE — Telephone Encounter (Signed)
LVM message for pt to return call  

## 2013-04-13 ENCOUNTER — Encounter: Payer: Self-pay | Admitting: Internal Medicine

## 2013-04-16 ENCOUNTER — Telehealth: Payer: Self-pay | Admitting: *Deleted

## 2013-04-16 NOTE — Telephone Encounter (Signed)
LVM message to return call  

## 2013-05-10 ENCOUNTER — Other Ambulatory Visit: Payer: Self-pay | Admitting: Internal Medicine

## 2013-05-10 NOTE — Telephone Encounter (Signed)
Refill request

## 2013-05-15 ENCOUNTER — Ambulatory Visit (INDEPENDENT_AMBULATORY_CARE_PROVIDER_SITE_OTHER): Payer: BC Managed Care – PPO | Admitting: Internal Medicine

## 2013-05-15 ENCOUNTER — Ambulatory Visit (HOSPITAL_BASED_OUTPATIENT_CLINIC_OR_DEPARTMENT_OTHER)
Admission: RE | Admit: 2013-05-15 | Discharge: 2013-05-15 | Disposition: A | Discharge status: Home / Self Care | Payer: BC Managed Care – PPO | Source: Ambulatory Visit | Attending: Internal Medicine | Admitting: Internal Medicine

## 2013-05-15 ENCOUNTER — Encounter: Payer: Self-pay | Admitting: Internal Medicine

## 2013-05-15 VITALS — BP 134/90 | HR 67 | Temp 97.0°F | Resp 18 | Wt 176.0 lb

## 2013-05-15 DIAGNOSIS — R911 Solitary pulmonary nodule: Secondary | ICD-10-CM | POA: Insufficient documentation

## 2013-05-15 DIAGNOSIS — I10 Essential (primary) hypertension: Secondary | ICD-10-CM

## 2013-05-15 DIAGNOSIS — F419 Anxiety disorder, unspecified: Secondary | ICD-10-CM

## 2013-05-15 DIAGNOSIS — K7689 Other specified diseases of liver: Secondary | ICD-10-CM | POA: Insufficient documentation

## 2013-05-15 DIAGNOSIS — K760 Fatty (change of) liver, not elsewhere classified: Secondary | ICD-10-CM | POA: Insufficient documentation

## 2013-05-15 DIAGNOSIS — N83209 Unspecified ovarian cyst, unspecified side: Secondary | ICD-10-CM

## 2013-05-15 DIAGNOSIS — F411 Generalized anxiety disorder: Secondary | ICD-10-CM

## 2013-05-15 DIAGNOSIS — Z23 Encounter for immunization: Secondary | ICD-10-CM

## 2013-05-15 LAB — CBC WITH DIFFERENTIAL/PLATELET
Basophils Absolute: 0.1 10*3/uL (ref 0.0–0.1)
Basophils Relative: 1 % (ref 0–1)
Eosinophils Absolute: 0.3 10*3/uL (ref 0.0–0.7)
HCT: 42 % (ref 36.0–46.0)
Hemoglobin: 14.2 g/dL (ref 12.0–15.0)
MCH: 28.6 pg (ref 26.0–34.0)
MCHC: 33.8 g/dL (ref 30.0–36.0)
Monocytes Absolute: 0.7 10*3/uL (ref 0.1–1.0)
Monocytes Relative: 8 % (ref 3–12)
Neutro Abs: 6.2 10*3/uL (ref 1.7–7.7)
RDW: 13.2 % (ref 11.5–15.5)

## 2013-05-15 MED ORDER — LOSARTAN POTASSIUM 50 MG PO TABS: 50.0000 mg | ORAL_TABLET | Freq: Every day | ORAL | Status: DC

## 2013-05-15 MED ORDER — FLUOXETINE HCL 10 MG PO CAPS: 10.0000 mg | ORAL_CAPSULE | Freq: Every day | ORAL | Status: DC

## 2013-05-15 NOTE — Progress Notes (Signed)
Subjective:    Patient ID: Jacqueline Calhoun, female    DOB: 08-06-1976, 37 y.o.   MRN: [id number]  HPI  Jacqueline Calhoun is here for ER follow up of 8/8.  We have communicated with her to schedule visit prior to this but this is the first time I have seen her  See ER visit.  Found to have ovarian cyst which is being followed by Dr. Seymour Bars - she has appt next week for follow up ultrasound  Found to have 4 mm lingular lung nodule on Ct  She doe snot smoke but lots of second hand smoke  Also noted to have hepatic steatosis  K 2.8 in ER  Pt reports she was not taking her K as the pill was hard to swallow.  She has stopped taking her HCTZ as well.     She also complains of insomnia.    She does not sleep well at night and nods of during th eday  She is on Prozac for her anxiety She would like to see a different psychiatrist.  I have referred her to Dr. Nolen Mu in the past but she did not make appt  No Known Allergies Past Medical History  Diagnosis Date  . IBS (irritable bowel syndrome)   . Anxiety   . Hypertension    History reviewed. No pertinent past surgical history. History   Social History  . Marital Status: Married    Spouse Name: N/A    Number of Children: N/A  . Years of Education: N/A   Occupational History  . Not on file.   Social History Main Topics  . Smoking status: Never Smoker   . Smokeless tobacco: Never Used  . Alcohol Use: 1.2 oz/week    2 Shots of liquor per week  . Drug Use: No  . Sexual Activity: Yes    Birth Control/ Protection: Condom   Other Topics Concern  . Not on file   Social History Narrative  . No narrative on file   Family History  Problem Relation Age of Onset  . Hypertension Mother   . Hypertension Father   . Anxiety disorder Father   . Heart disease Maternal Grandmother   . Heart disease Maternal Grandfather    Patient Active Problem List   Diagnosis Date Noted  . Ovarian cyst 05/15/2013  . Depression 10/30/2012  . Left otitis externa  03/03/2012  . Allergic rhinitis 11/16/2011  . Irregular menses 11/16/2011  . Anxiety   . IBS (irritable bowel syndrome)   . Hypertension    Current Outpatient Prescriptions on File Prior to Visit  Medication Sig Dispense Refill  . calcium-vitamin D (OSCAL WITH D) 250-125 MG-UNIT per tablet Take 1 tablet by mouth daily.        Marland Kitchen losartan (COZAAR) 25 MG tablet Take 1 tablet (25 mg total) by mouth daily.  30 tablet  0  . Magnesium 250 MG TABS Take 1 tablet by mouth daily.        . Multiple Vitamin (MULTIVITAMIN) tablet Take 1 tablet by mouth daily.        . Probiotic Product (PROBIOTIC ACIDOPHILUS PO) Take 1 tablet by mouth daily.        . clonazePAM (KLONOPIN) 0.5 MG tablet Take 0.5 tablets (0.25 mg total) by mouth 2 (two) times daily as needed for anxiety.  30 tablet  1  . fluticasone (FLONASE) 50 MCG/ACT nasal spray Place 2 sprays into the nose daily.  16 g  1  . hydrochlorothiazide (HYDRODIURIL)  25 MG tablet TAKE 1 TABLET BY MOUTH EVERY DAY  30 tablet  2  . potassium chloride SA (K-DUR,KLOR-CON) 20 MEQ tablet Take 1 tablet (20 mEq total) by mouth daily.  30 tablet  1   No current facility-administered medications on file prior to visit.       Review of Systems    see HPI Objective:   Physical Exam Physical Exam  Nursing note and vitals reviewed.  Constitutional: She is oriented to person, place, and time. She appears well-developed and well-nourished.  HENT:  Head: Normocephalic and atraumatic.  Cardiovascular: Normal rate and regular rhythm. Exam reveals no gallop and no friction rub.  No murmur heard.  Pulmonary/Chest: Breath sounds normal. She has no wheezes. She has no rales.  Neurological: She is alert and oriented to person, place, and time.  Skin: Skin is warm and dry.  Psychiatric: She has a normal mood and affect. Her behavior is normal.             Assessment & Plan:  Lunular lung nodule  Will get chest CT    Hypokalemia  Will check today  HTN  Will  increase Cozaar to 50 mg daily.  She is to see me in 3 months  Hepatic steatosis  Insomnia  Will let her psychiatrist manage this.  Suspect due to uncontrolled psychiatric symptoms.    Ovarian cyst  manaaged GYN  See me in 3 months,  Flu vaccine today

## 2013-05-15 NOTE — Patient Instructions (Addendum)
Call Dr. Nolen Mu for apt  [phone]  See me in 3 months

## 2013-05-15 NOTE — Addendum Note (Signed)
Addended by: Mathews Robinsons on: 05/15/2013 11:37 AM   Modules accepted: Orders

## 2013-05-16 ENCOUNTER — Encounter: Payer: Self-pay | Admitting: *Deleted

## 2013-05-17 ENCOUNTER — Encounter: Payer: Self-pay | Admitting: Internal Medicine

## 2013-05-17 ENCOUNTER — Telehealth: Payer: Self-pay | Admitting: *Deleted

## 2013-05-17 NOTE — Telephone Encounter (Signed)
Message copied by Mathews Robinsons on Thu May 17, 2013  3:19 PM ------      Message from: Raechel Chute D      Created: Thu May 17, 2013 11:38 AM       Karen Kitchens            Call [facility] and let her know that it looks like as best as can be reviewed that nodule is a lymph node - it is smaller in size.  Very reassuring  Ok to mail a copy of Ct to her if she wishes.        ------

## 2013-05-17 NOTE — Telephone Encounter (Signed)
Notified pt of Ct results 

## 2013-06-22 ENCOUNTER — Encounter: Payer: Self-pay | Admitting: Internal Medicine

## 2013-08-07 ENCOUNTER — Encounter: Payer: Self-pay | Admitting: Internal Medicine

## 2013-08-07 ENCOUNTER — Ambulatory Visit (INDEPENDENT_AMBULATORY_CARE_PROVIDER_SITE_OTHER): Payer: BC Managed Care – PPO | Admitting: Internal Medicine

## 2013-08-07 VITALS — BP 138/86 | HR 89 | Temp 96.7°F | Resp 18 | Wt 177.0 lb

## 2013-08-07 DIAGNOSIS — K760 Fatty (change of) liver, not elsewhere classified: Secondary | ICD-10-CM

## 2013-08-07 DIAGNOSIS — F411 Generalized anxiety disorder: Secondary | ICD-10-CM

## 2013-08-07 DIAGNOSIS — F329 Major depressive disorder, single episode, unspecified: Secondary | ICD-10-CM

## 2013-08-07 DIAGNOSIS — E876 Hypokalemia: Secondary | ICD-10-CM

## 2013-08-07 DIAGNOSIS — K7689 Other specified diseases of liver: Secondary | ICD-10-CM

## 2013-08-07 DIAGNOSIS — J029 Acute pharyngitis, unspecified: Secondary | ICD-10-CM

## 2013-08-07 DIAGNOSIS — F419 Anxiety disorder, unspecified: Secondary | ICD-10-CM

## 2013-08-07 MED ORDER — AZITHROMYCIN 250 MG PO TABS: ORAL_TABLET | ORAL | Status: DC

## 2013-08-07 NOTE — Progress Notes (Signed)
Subjective:    Patient ID: Jacqueline Calhoun, female    DOB: 04-Jul-1976, 37 y.o.   MRN: [id number]  HPI  Jacqueline Calhoun is here to follow up on her HTN,    Mixed anxiety and depression ,  And hepatic steatosis   She had a low K in August and for some reason the lab did not run this test.    She has had two weeks of sore throat  No fever  Rare cough  No chest pain  She works as a Runner, broadcasting/film/video  She tells me she is taking her Cozaar and she has stopped  Her HCTZ.  She is not taking any Klonopin but is taking her Prozac.   She has not made an appt with a psychiatrist as I have advised her to do on several visits  She has  Been placed on low dose OC's by her GYN but is now taking progesterone creme  OTC  She is hoping it would help with her fatigue and libido  No Known Allergies Past Medical History  Diagnosis Date  . IBS (irritable bowel syndrome)   . Anxiety   . Hypertension    History reviewed. No pertinent past surgical history. History   Social History  . Marital Status: Married    Spouse Name: N/A    Number of Children: N/A  . Years of Education: N/A   Occupational History  . Not on file.   Social History Main Topics  . Smoking status: Never Smoker   . Smokeless tobacco: Never Used  . Alcohol Use: 1.2 oz/week    2 Shots of liquor per week  . Drug Use: No  . Sexual Activity: Yes    Birth Control/ Protection: Condom   Other Topics Concern  . Not on file   Social History Narrative  . No narrative on file   Family History  Problem Relation Age of Onset  . Hypertension Mother   . Hypertension Father   . Anxiety disorder Father   . Heart disease Maternal Grandmother   . Heart disease Maternal Grandfather    Patient Active Problem List   Diagnosis Date Noted  . Ovarian cyst 05/15/2013  . Lung nodule seen on imaging study 05/15/2013  . Hepatic steatosis 05/15/2013  . Depression 10/30/2012  . Left otitis externa 03/03/2012  . Allergic rhinitis 11/16/2011  . Irregular menses  11/16/2011  . Anxiety   . IBS (irritable bowel syndrome)   . Hypertension    Current Outpatient Prescriptions on File Prior to Visit  Medication Sig Dispense Refill  . B Complex-C (B-COMPLEX WITH VITAMIN C) tablet Take 1 tablet by mouth daily.      . calcium-vitamin D (OSCAL WITH D) 250-125 MG-UNIT per tablet Take 1 tablet by mouth daily.        Marland Kitchen FLUoxetine (PROZAC) 10 MG capsule Take 1 capsule (10 mg total) by mouth daily.  90 capsule  0  . losartan (COZAAR) 50 MG tablet Take 1 tablet (50 mg total) by mouth daily.  90 tablet  3  . Magnesium 250 MG TABS Take 1 tablet by mouth daily.        . Multiple Vitamin (MULTIVITAMIN) tablet Take 1 tablet by mouth daily.        . Probiotic Product (PROBIOTIC ACIDOPHILUS PO) Take 1 tablet by mouth daily.        . clonazePAM (KLONOPIN) 0.5 MG tablet Take 0.5 tablets (0.25 mg total) by mouth 2 (two) times daily as needed for  anxiety.  30 tablet  1  . fluticasone (FLONASE) 50 MCG/ACT nasal spray Place 2 sprays into the nose daily.  16 g  1  . hydrochlorothiazide (HYDRODIURIL) 25 MG tablet TAKE 1 TABLET BY MOUTH EVERY DAY  30 tablet  2  . potassium chloride SA (K-DUR,KLOR-CON) 20 MEQ tablet Take 1 tablet (20 mEq total) by mouth daily.  30 tablet  1   No current facility-administered medications on file prior to visit.      Review of Systems     Objective:   Physical Exam Physical Exam  Nursing note and vitals reviewed.  Constitutional: She is oriented to person, place, and time. She appears well-developed and well-nourished.  HENT:  Head: Normocephalic and atraumatic.  O/P  Erythematous tonsills  No exudates  No cervical adenopathy Cardiovascular: Normal rate and regular rhythm. Exam reveals no gallop and no friction rub.  No murmur heard.  Pulmonary/Chest: Breath sounds normal. She has no wheezes. She has no rales.  Neurological: She is alert and oriented to person, place, and time.  Skin: Skin is warm and dry.  Psychiatric: She has a normal  mood and affect. Her behavior is normal.             Assessment & Plan:  HTN  Continue Cozaar  LOw libido:  Advised that addition of OTC progesterone creme unlikely to help libido or energy  Mixed anxiety/depression  I again advised pt that some of her symptoms  (fatigue, low energy, insomnia)  may be helped with appropriate psychiatric care.  She has been given names of local psychiatrists several times  Insomnia  I offered referral for sleep evaluation but pt declined    Heaptic steatosis  Will check lfts today  Pharyngitis  Likely viral.  I gave RX for z-pack if no improvement in 5 days she may take this  Hypokalemia in past will check today

## 2013-08-07 NOTE — Patient Instructions (Signed)
Labs today  If you continue to  have trouble sleeping call office for referral

## 2013-08-08 ENCOUNTER — Encounter: Payer: Self-pay | Admitting: *Deleted

## 2013-08-08 ENCOUNTER — Telehealth: Payer: Self-pay | Admitting: *Deleted

## 2013-08-08 LAB — COMPREHENSIVE METABOLIC PANEL
AST: 36 U/L (ref 0–37)
Albumin: 4.1 g/dL (ref 3.5–5.2)
Alkaline Phosphatase: 68 U/L (ref 39–117)
BUN: 16 mg/dL (ref 6–23)
Creat: 0.73 mg/dL (ref 0.50–1.10)
Glucose, Bld: 79 mg/dL (ref 70–99)
Total Bilirubin: 0.4 mg/dL (ref 0.3–1.2)

## 2013-08-08 NOTE — Telephone Encounter (Signed)
Called Jodene about lab work and to schedule a follow up appt.  Left message on her home VM for her to call us back.

## 2013-08-13 ENCOUNTER — Ambulatory Visit: Payer: BC Managed Care – PPO | Admitting: Internal Medicine

## 2013-09-04 ENCOUNTER — Ambulatory Visit (INDEPENDENT_AMBULATORY_CARE_PROVIDER_SITE_OTHER): Payer: BC Managed Care – PPO | Admitting: Internal Medicine

## 2013-09-04 ENCOUNTER — Encounter: Payer: Self-pay | Admitting: Internal Medicine

## 2013-09-04 ENCOUNTER — Other Ambulatory Visit: Payer: Self-pay | Admitting: Internal Medicine

## 2013-09-04 VITALS — BP 128/85 | HR 79 | Temp 98.1°F | Resp 16 | Wt 176.0 lb

## 2013-09-04 DIAGNOSIS — R945 Abnormal results of liver function studies: Principal | ICD-10-CM

## 2013-09-04 DIAGNOSIS — R7989 Other specified abnormal findings of blood chemistry: Secondary | ICD-10-CM

## 2013-09-04 DIAGNOSIS — K760 Fatty (change of) liver, not elsewhere classified: Secondary | ICD-10-CM

## 2013-09-04 DIAGNOSIS — K7689 Other specified diseases of liver: Secondary | ICD-10-CM

## 2013-09-04 LAB — ACUTE HEP PANEL AND HEP B SURFACE AB
HCV Ab: NEGATIVE
HEP B S AG: NEGATIVE
Hep A IgM: NONREACTIVE
Hep B C IgM: NONREACTIVE
Hep B S Ab: NEGATIVE

## 2013-09-04 LAB — HEPATITIS PANEL, ACUTE
HCV AB: NEGATIVE
HEP B C IGM: NONREACTIVE
Hep A IgM: NONREACTIVE
Hepatitis B Surface Ag: NEGATIVE

## 2013-09-04 NOTE — Progress Notes (Signed)
Subjective:    Patient ID: Jacqueline Calhoun, female    DOB: Apr 08, 1976, 38 y.o.   MRN: [id number]  HPI  Jacqueline Calhoun is here for follow up she has minimally elevated ALT and known hepatic steatosis.  She denies ETOH ingestion or hepatitis exposure.  NO OTC herbs or supplements  See CT  She did see Dr. Dellis Filbert for her ovarian cyst and states she did have follow up ultrasound in her office  No Known Allergies Past Medical History  Diagnosis Date  . IBS (irritable bowel syndrome)   . Anxiety   . Hypertension    History reviewed. No pertinent past surgical history. History   Social History  . Marital Status: Married    Spouse Name: N/A    Number of Children: N/A  . Years of Education: N/A   Occupational History  . Not on file.   Social History Main Topics  . Smoking status: Never Smoker   . Smokeless tobacco: Never Used  . Alcohol Use: 1.2 oz/week    2 Shots of liquor per week  . Drug Use: No  . Sexual Activity: Yes    Birth Control/ Protection: Condom   Other Topics Concern  . Not on file   Social History Narrative  . No narrative on file   Family History  Problem Relation Age of Onset  . Hypertension Mother   . Hypertension Father   . Anxiety disorder Father   . Heart disease Maternal Grandmother   . Heart disease Maternal Grandfather    Patient Active Problem List   Diagnosis Date Noted  . Ovarian cyst 05/15/2013  . Lung nodule seen on imaging study 05/15/2013  . Hepatic steatosis 05/15/2013  . Depression 10/30/2012  . Left otitis externa 03/03/2012  . Allergic rhinitis 11/16/2011  . Irregular menses 11/16/2011  . Anxiety   . IBS (irritable bowel syndrome)   . Hypertension    Current Outpatient Prescriptions on File Prior to Visit  Medication Sig Dispense Refill  . B Complex-C (B-COMPLEX WITH VITAMIN C) tablet Take 1 tablet by mouth daily.      . calcium-vitamin D (OSCAL WITH D) 250-125 MG-UNIT per tablet Take 1 tablet by mouth daily.        Marland Kitchen losartan  (COZAAR) 50 MG tablet Take 1 tablet (50 mg total) by mouth daily.  90 tablet  3  . Magnesium 250 MG TABS Take 1 tablet by mouth daily.        . Multiple Vitamin (MULTIVITAMIN) tablet Take 1 tablet by mouth daily.        . Probiotic Product (PROBIOTIC ACIDOPHILUS PO) Take 1 tablet by mouth daily.        Marland Kitchen FLUoxetine (PROZAC) 10 MG capsule Take 1 capsule (10 mg total) by mouth daily.  90 capsule  0  . fluticasone (FLONASE) 50 MCG/ACT nasal spray Place 2 sprays into the nose daily.  16 g  1   No current facility-administered medications on file prior to visit.      Review of Systems See HPI    Objective:   Physical Exam Physical Exam  Nursing note and vitals reviewed.  Constitutional: She is oriented to person, place, and time. She appears well-developed and well-nourished.  HENT:  Head: Normocephalic and atraumatic.  Cardiovascular: Normal rate and regular rhythm. Exam reveals no gallop and no friction rub.  No murmur heard.  Pulmonary/Chest: Breath sounds normal. She has no wheezes. She has no rales. Abd:  BS+  Soft no tenderness in  any quadrant  No HSM  Neurological: She is alert and oriented to person, place, and time.  Skin: Skin is warm and dry.  Psychiatric: She has a normal mood and affect. Her behavior is normal.        Assessment & Plan:  Elevation of ALT with known hepatic steatosis  Will check hepatitis panel and ANA today.  ADvised to avoid keep ETOH to one per 24 hours and no OTC herbs and supplemets  Ovarian cyst  Followed by gyn.  Heapatic steatosis    See me in 4-6 months as needed

## 2013-09-05 ENCOUNTER — Encounter: Payer: Self-pay | Admitting: Internal Medicine

## 2013-09-05 LAB — ANA: Anti Nuclear Antibody(ANA): NEGATIVE

## 2013-09-14 ENCOUNTER — Encounter: Payer: Self-pay | Admitting: Internal Medicine

## 2013-09-17 ENCOUNTER — Other Ambulatory Visit: Payer: Self-pay | Admitting: *Deleted

## 2013-09-17 MED ORDER — FLUOXETINE HCL 10 MG PO CAPS: 10.0000 mg | ORAL_CAPSULE | Freq: Every day | ORAL | Status: DC

## 2013-09-17 NOTE — Telephone Encounter (Signed)
Pt has an appt with Letta Moynahan on 2/16 would like refill until then

## 2013-12-20 ENCOUNTER — Ambulatory Visit: Payer: BC Managed Care – PPO

## 2014-03-06 ENCOUNTER — Ambulatory Visit: Payer: BC Managed Care – PPO | Admitting: Internal Medicine

## 2014-03-22 ENCOUNTER — Other Ambulatory Visit: Payer: Self-pay | Admitting: Internal Medicine

## 2014-03-25 ENCOUNTER — Telehealth: Payer: Self-pay | Admitting: *Deleted

## 2014-03-25 ENCOUNTER — Other Ambulatory Visit: Payer: Self-pay | Admitting: *Deleted

## 2014-03-25 MED ORDER — LOSARTAN POTASSIUM 50 MG PO TABS: 50.0000 mg | ORAL_TABLET | Freq: Every day | ORAL | Status: DC

## 2014-03-25 NOTE — Telephone Encounter (Signed)
Request sent 

## 2014-03-25 NOTE — Telephone Encounter (Signed)
Sent refill request

## 2014-03-25 NOTE — Telephone Encounter (Signed)
Evey, Mcmahan - 03/22/2014 11:10 AM ','<More Detail >>       Rx Auth Request    Surescripts Out Interface        Sent: Fri March 22, 2014 11:10 AM    To: [facility] Clinical Pool                   Message     ----- Message from [place] sent at 03/22/2014 11:10 AM -----         The demographic information from the pharmacy is:    Patient Name: [facility]    Patient DOB: 1976-05-21    Patient Gender: Female    Address:    [place]    [facility]    [phone]             Amalee Olsen MRN: [id number] Home: [phone]  38 y.o. / Female (1976/01/30) PCP: Lanice Shirts, MD Work: [phone]  Pharmacy: [id number] - [place], [address] Ph: [phone] Wt: 176 lb (79.833 kg) (09/04/2013) Mobile: [phone]          Guarantor Account: Loel Ro ([id number])     Relation to Patient: Account Type Service Area    Self Personal/Family [place] for This Account     Coverage ID Payor Plan Insurance ID     [place] [id number]             Guarantor Account: Elayah, [place] ([id number])     Relation to Patient: Account Type Service Area    Spouse Personal/Family [place] for This Account     Coverage ID Payor Plan Insurance ID     [id number] [place] [id number]             Guarantor Account: Larna, [place] ([id number])     Relation to Patient: Account Type Service Area    Self Personal/Family GAAM-GAAIM [facility]                   Requested Medications     Medication name:  Name from pharmacy:  losartan (COZAAR) 50 MG tablet  LOSARTAN 50MG  TABLETS    Sig: TAKE 1 TABLET BY MOUTH DAILY    Dispense: 90 tablet Refills: 0 Start: 03/22/2014  Class:  Normal    Requested on: 03/22/2014    Originally ordered on: 05/15/2013 Last refill: 03/22/2014 Order History and Details              Call Documentation     No notes of this type exist for this encounter.             Contacts       Type Contact Phone    03/22/2014 11:10 AM Interface (Incoming) [facility] [phone]              Allergies as of 03/22/2014 Review Complete On: 09/04/2013 By: Marcial Pacas, RN     No Known Allergies             Patient Flags     No FYI flags for this patient.  Outstanding Procedures      Open Future Orders       Priority Expected Expires Ordered    CT Chest Wo Contrast Routine  07/15/2014 05/15/2013           Normal Orders       Priority Ordered    Comprehensive metabolic panel Routine 12/06/1446              Past Appointments     Date Time Status Provider Dept Type Appt Notes    03/06/2014 8:15 AM Can Lanice Shirts, MD [facility] ESTPT30 Called on 6 different days and left messages about needing to reschedule appt./ No return call/ 6 month follow up/ hb    12/20/2013 11:45 AM Left [facility] ([facility]) [facility] OV PATIENT LEFT NOT SEEN  EAR ACHE, GO    09/04/2013 8:30 AM Comp Lanice Shirts, MD [facility] ESTPT30 follow up on labs/ hb    08/13/2013 8:15 AM Can Lanice Shirts, MD [facility] ESTPT30 3 month follow up/ hb    08/07/2013 11:15 AM Comp Lanice Shirts, MD [facility] ESTPT30 3 month follow up/ hb    05/15/2013 9:25 AM Comp [facility]Ct 1 [facility]-CT CTCHEST Pre auth # [id number]    05/15/2013 8:15 AM Comp Lanice Shirts, MD [place] ESTPT30 F/U er visit    04/06/2013 3:10 PM Comp Mhp-Us 1 MHP-US USNONOBTV     04/06/2013 1:10 PM Comp [facility]-Ct 1 MHP-CT CTBODYW     10/30/2012 8:45 AM Comp Lanice Shirts, MD [facility] ESTPT15 general questions...f/u.Marland Kitchenad               Pharmacy     [id number] - [place], [address] AT [facility] [address]    [place] [address]    Phone: [phone] Fax: [phone]    Open 24 Hours?: No          Refill request

## 2014-03-25 NOTE — Telephone Encounter (Signed)
Refill request

## 2014-05-20 ENCOUNTER — Telehealth: Payer: Self-pay | Admitting: *Deleted

## 2014-05-20 NOTE — Telephone Encounter (Signed)
I spoke with Bethanne Ginger and let her know that her labs came back normal. I also mailed her a copy of the labs-eh

## 2014-06-04 ENCOUNTER — Encounter: Payer: Self-pay | Admitting: Internal Medicine

## 2014-06-04 ENCOUNTER — Ambulatory Visit (INDEPENDENT_AMBULATORY_CARE_PROVIDER_SITE_OTHER): Payer: BC Managed Care – PPO | Admitting: Internal Medicine

## 2014-06-04 ENCOUNTER — Ambulatory Visit (HOSPITAL_BASED_OUTPATIENT_CLINIC_OR_DEPARTMENT_OTHER)
Admission: RE | Admit: 2014-06-04 | Discharge: 2014-06-04 | Disposition: A | Discharge status: Home / Self Care | Payer: BC Managed Care – PPO | Source: Ambulatory Visit | Attending: Internal Medicine | Admitting: Internal Medicine

## 2014-06-04 VITALS — BP 159/91 | HR 88 | Temp 98.9°F | Resp 18 | Ht 64.5 in | Wt 178.0 lb

## 2014-06-04 DIAGNOSIS — R05 Cough: Secondary | ICD-10-CM

## 2014-06-04 DIAGNOSIS — J209 Acute bronchitis, unspecified: Secondary | ICD-10-CM | POA: Diagnosis not present

## 2014-06-04 DIAGNOSIS — R7989 Other specified abnormal findings of blood chemistry: Secondary | ICD-10-CM | POA: Diagnosis not present

## 2014-06-04 DIAGNOSIS — I10 Essential (primary) hypertension: Secondary | ICD-10-CM | POA: Insufficient documentation

## 2014-06-04 DIAGNOSIS — K760 Fatty (change of) liver, not elsewhere classified: Secondary | ICD-10-CM

## 2014-06-04 DIAGNOSIS — R059 Cough, unspecified: Secondary | ICD-10-CM

## 2014-06-04 DIAGNOSIS — R945 Abnormal results of liver function studies: Secondary | ICD-10-CM

## 2014-06-04 LAB — HEPATIC FUNCTION PANEL
ALK PHOS: 65 U/L (ref 39–117)
ALT: 51 U/L — ABNORMAL HIGH (ref 0–35)
AST: 31 U/L (ref 0–37)
Albumin: 4 g/dL (ref 3.5–5.2)
BILIRUBIN DIRECT: 0.1 mg/dL (ref 0.0–0.3)
BILIRUBIN INDIRECT: 0.4 mg/dL (ref 0.2–1.2)
TOTAL PROTEIN: 6.6 g/dL (ref 6.0–8.3)
Total Bilirubin: 0.5 mg/dL (ref 0.2–1.2)

## 2014-06-04 MED ORDER — CEFTRIAXONE SODIUM 1 G IJ SOLR
1.0000 g | Freq: Once | INTRAMUSCULAR | Status: AC
  Administered 2014-06-04: 1 g via INTRAMUSCULAR

## 2014-06-04 MED ORDER — AZITHROMYCIN 250 MG PO TABS: ORAL_TABLET | ORAL | Status: DC

## 2014-06-04 MED ORDER — HYDROCODONE-HOMATROPINE 5-1.5 MG/5ML PO SYRP: 5.0000 mL | ORAL_SOLUTION | Freq: Three times a day (TID) | ORAL | Status: DC | PRN

## 2014-06-04 NOTE — Patient Instructions (Signed)
See me as needed 

## 2014-06-04 NOTE — Progress Notes (Signed)
Subjective:    Patient ID: Jacqueline Calhoun, female    DOB: Jan 02, 1976, 38 y.o.   MRN: [id number]  HPI  Prior OV Elevation of ALT with known hepatic steatosis Will check hepatitis panel and ANA today. ADvised to avoid keep ETOH to one per 24 hours and no OTC herbs and supplemets  Ovarian cyst Followed by gyn.  Heapatic steatosis  See me in 4-6 months as needed   Today  Jacqueline Calhoun is here for acute visit   No Known Allergies Past Medical History  Diagnosis Date  . IBS (irritable bowel syndrome)   . Anxiety   . Hypertension    No past surgical history on file. History   Social History  . Marital Status: Married    Spouse Name: N/A    Number of Children: N/A  . Years of Education: N/A   Occupational History  . Not on file.   Social History Main Topics  . Smoking status: Never Smoker   . Smokeless tobacco: Never Used  . Alcohol Use: 1.2 oz/week    2 Shots of liquor per week  . Drug Use: No  . Sexual Activity: Yes    Birth Control/ Protection: Condom   Other Topics Concern  . Not on file   Social History Narrative  . No narrative on file   Family History  Problem Relation Age of Onset  . Hypertension Mother   . Hypertension Father   . Anxiety disorder Father   . Heart disease Maternal Grandmother   . Heart disease Maternal Grandfather    Patient Active Problem List   Diagnosis Date Noted  . Ovarian cyst 05/15/2013  . Lung nodule seen on imaging study 05/15/2013  . Hepatic steatosis 05/15/2013  . Depression 10/30/2012  . Left otitis externa 03/03/2012  . Allergic rhinitis 11/16/2011  . Irregular menses 11/16/2011  . Anxiety   . IBS (irritable bowel syndrome)   . Hypertension    Current Outpatient Prescriptions on File Prior to Visit  Medication Sig Dispense Refill  . B Complex-C (B-COMPLEX WITH VITAMIN C) tablet Take 1 tablet by mouth daily.      Marland Kitchen BLACK COHOSH PO Take 1 tablet by mouth at bedtime.      . calcium-vitamin D (OSCAL WITH D) 250-125 MG-UNIT  per tablet Take 1 tablet by mouth daily.        Marland Kitchen FLUoxetine (PROZAC) 10 MG capsule Take 1 capsule (10 mg total) by mouth daily.  90 capsule  2  . fluticasone (FLONASE) 50 MCG/ACT nasal spray Place 2 sprays into the nose daily.  16 g  1  . losartan (COZAAR) 50 MG tablet Take 1 tablet (50 mg total) by mouth daily.  90 tablet  0  . Magnesium 250 MG TABS Take 1 tablet by mouth daily.        . Melatonin 5 MG TABS Take 1 tablet by mouth at bedtime.      . Multiple Vitamin (MULTIVITAMIN) tablet Take 1 tablet by mouth daily.        . Probiotic Product (PROBIOTIC ACIDOPHILUS PO) Take 1 tablet by mouth daily.         No current facility-administered medications on file prior to visit.      Review of Systems    see HPI Objective:   Physical Exam  Physical Exam  Nursing note and vitals reviewed.  Constitutional: She is oriented to person, place, and time. She appears well-developed and well-nourished.  HENT:  Head: Normocephalic and atraumatic.  Cardiovascular: Normal rate and regular rhythm. Exam reveals no gallop and no friction rub.  No murmur heard.  Pulmonary/Chest: Breath sounds normal. She has no wheezes. She has no rales.  Neurological: She is alert and oriented to person, place, and time.  Skin: Skin is warm and dry.  Psychiatric: She has a normal mood and affect. Her behavior is normal.             Assessment & Plan:

## 2014-06-04 NOTE — Progress Notes (Signed)
Subjective:    Patient ID: Jacqueline Calhoun, female    DOB: 1975/08/31, 38 y.o.   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    No documented fever no chest pain    Elevated lfts. She tells me her OB checked her lfts and "they went up"  I do not have report.  She does have severe hepatic steatosis on imaging.   No Known Allergies Past Medical History  Diagnosis Date  . IBS (irritable bowel syndrome)   . Anxiety   . Hypertension    History reviewed. No pertinent past surgical history. History   Social History  . Marital Status: Married    Spouse Name: N/A    Number of Children: N/A  . Years of Education: N/A   Occupational History  . Not on file.   Social History Main Topics  . Smoking status: Never Smoker   . Smokeless tobacco: Never Used  . Alcohol Use: 1.2 oz/week    2 Shots of liquor per week  . Drug Use: No  . Sexual Activity: Yes    Birth Control/ Protection: Condom   Other Topics Concern  . Not on file   Social History Narrative  . No narrative on file   Family History  Problem Relation Age of Onset  . Hypertension Mother   . Hypertension Father   . Anxiety disorder Father   . Heart disease Maternal Grandmother   . Heart disease Maternal Grandfather    Patient Active Problem List   Diagnosis Date Noted  . Ovarian cyst 05/15/2013  . Lung nodule seen on imaging study 05/15/2013  . Hepatic steatosis 05/15/2013  . Depression 10/30/2012  . Left otitis externa 03/03/2012  . Allergic rhinitis 11/16/2011  . Irregular menses 11/16/2011  . Anxiety   . IBS (irritable bowel syndrome)   . Hypertension    Current Outpatient Prescriptions on File Prior to Visit  Medication Sig Dispense Refill  . B Complex-C (B-COMPLEX WITH VITAMIN C) tablet Take 1 tablet by mouth daily.      . calcium-vitamin D (OSCAL WITH D) 250-125 MG-UNIT per tablet Take 1 tablet by mouth daily.        . fluticasone  (FLONASE) 50 MCG/ACT nasal spray Place 2 sprays into the nose daily.  16 g  1  . losartan (COZAAR) 50 MG tablet Take 1 tablet (50 mg total) by mouth daily.  90 tablet  0  . Melatonin 5 MG TABS Take 1 tablet by mouth at bedtime.      . Multiple Vitamin (MULTIVITAMIN) tablet Take 1 tablet by mouth daily.        . Probiotic Product (PROBIOTIC ACIDOPHILUS PO) Take 1 tablet by mouth daily.        Marland Kitchen BLACK COHOSH PO Take 1 tablet by mouth at bedtime.       No current facility-administered medications on file prior to visit.       Review of Systems See HPI    Objective:   Physical Exam  Physical Exam  Nursing note and vitals reviewed.  Constitutional: She is oriented to person, place, and time. She appears well-developed and well-nourished. She is cooperative.  HENT:  Head: Normocephalic and atraumatic.  Nose: Mucosal edema present.  Eyes: Conjunctivae and EOM are normal. Pupils are equal, round, and reactive to light.  Neck: Neck supple.  Cardiovascular: Regular rhythm, normal heart sounds, intact distal pulses and  normal pulses. Exam reveals no gallop and no friction rub.  No murmur heard.  Pulmonary/Chest: She has no wheezes. She has rhonchi. She has no rales.  Neurological: She is alert and oriented to person, place, and time.  Skin: Skin is warm and dry. No abrasion, no bruising, no ecchymosis and no rash noted. No cyanosis. Nails show no clubbing.  Psychiatric: She has a normal mood and affect. Her speech is normal and behavior is normal.           Assessment & Plan:  Bronchitis  Will give Rocephin 1 gm in office  RXZ-pak   Cough hycodan prn   Elevated lfts/hepatic steatosis  Will recheck today   See me in 5-7 days if not better

## 2014-06-06 NOTE — Progress Notes (Signed)
I left a message for Lorien letting her know that she needs to keep the appointment with [place] next week and to bring her copy of the labs from her GYN with her to that appointment. I also mailed her a copy of the labs. -eh

## 2014-06-09 NOTE — Progress Notes (Signed)
Subjective:    Patient ID: Jacqueline Calhoun, female    DOB: 01/22/1976, 38 y.o.   MRN: [id number]  HPI  Prior visit Bronchitis Will give Rocephin 1 gm in office RXZ-pak  Cough hycodan prn  Elevated lfts/hepatic steatosis Will recheck today  See me in 5-7 days if not better  Today's visit   Coughing less but has hoarseness.  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  CXR is normal  The laryngitis bothers her   See labs from GYN   Done 03/2014.  Tranaminases elevated in the 50's and 70's .    Recent hepatic profile improved  My last visit.    AST normal now with alt 51.  She has known hepatic steatosis and hepatitis and ANA negative She denies ETOH ingestion  Or OTC supplements   She would like to see a GI MD about this    No Known Allergies Past Medical History  Diagnosis Date  . IBS (irritable bowel syndrome)   . Anxiety   . Hypertension    No past surgical history on file. History   Social History  . Marital Status: Married    Spouse Name: N/A    Number of Children: N/A  . Years of Education: N/A   Occupational History  . Not on file.   Social History Main Topics  . Smoking status: Never Smoker   . Smokeless tobacco: Never Used  . Alcohol Use: 1.2 oz/week    2 Shots of liquor per week  . Drug Use: No  . Sexual Activity: Yes    Birth Control/ Protection: Condom   Other Topics Concern  . Not on file   Social History Narrative  . No narrative on file   Family History  Problem Relation Age of Onset  . Hypertension Mother   . Hypertension Father   . Anxiety disorder Father   . Heart disease Maternal Grandmother   . Heart disease Maternal Grandfather    Patient Active Problem List   Diagnosis Date Noted  . Elevated LFTs 06/04/2014  . Ovarian cyst 05/15/2013  . Lung nodule seen on imaging study 05/15/2013  . Hepatic steatosis 05/15/2013  . Depression 10/30/2012  . Left otitis externa 03/03/2012  .  Allergic rhinitis 11/16/2011  . Irregular menses 11/16/2011  . Anxiety   . IBS (irritable bowel syndrome)   . Hypertension    Current Outpatient Prescriptions on File Prior to Visit  Medication Sig Dispense Refill  . azithromycin (ZITHROMAX) 250 MG tablet Take as directed  6 tablet  0  . B Complex-C (B-COMPLEX WITH VITAMIN C) tablet Take 1 tablet by mouth daily.      Marland Kitchen BLACK COHOSH PO Take 1 tablet by mouth at bedtime.      . calcium-vitamin D (OSCAL WITH D) 250-125 MG-UNIT per tablet Take 1 tablet by mouth daily.        . fluticasone (FLONASE) 50 MCG/ACT nasal spray Place 2 sprays into the nose daily.  16 g  1  . HYDROcodone-homatropine (HYCODAN) 5-1.5 MG/5ML syrup Take 5 mLs by mouth every 8 (eight) hours as needed for cough.  120 mL  0  . losartan (COZAAR) 50 MG tablet Take 1 tablet (50 mg total) by mouth daily.  90 tablet  0  . Melatonin 5 MG TABS Take 1 tablet by mouth at bedtime.      . Multiple Vitamin (MULTIVITAMIN) tablet Take 1 tablet by  mouth daily.        . norgestrel-ethinyl estradiol (LO/OVRAL,CRYSELLE) 0.3-30 MG-MCG tablet Take 1 tablet by mouth daily.      . Probiotic Product (PROBIOTIC ACIDOPHILUS PO) Take 1 tablet by mouth daily.         No current facility-administered medications on file prior to visit.      Review of Systems    see HPI Objective:   Physical Exam Physical Exam  Nursing note and vitals reviewed.  Constitutional: She is oriented to person, place, and time. She appears well-developed and well-nourished.  HENT:  Head: Normocephalic and atraumatic.  Cardiovascular: Normal rate and regular rhythm. Exam reveals no gallop and no friction rub.  No murmur heard.  Pulmonary/Chest: Breath sounds normal. She has no wheezes. She has no rales.  Neurological: She is alert and oriented to person, place, and time.  Skin: Skin is warm and dry.  Psychiatric: She has a normal mood and affect. Her behavior is normal.         Assessment & Plan:  Elevated  lfts/hepatic steatosis    lfts improved advised low fat diet.     She wishes to see GI MD ok to refer to [place]  Bronchitis Jacqueline Calhoun  OK for albuterol nebs in office and Tessalon perles.    ADvised not to sing until hoarseness improved.   I advised oral prednisone but pt declines   See me in 6 months or sooner prn if cough not better

## 2014-06-10 ENCOUNTER — Encounter: Payer: Self-pay | Admitting: Internal Medicine

## 2014-06-10 ENCOUNTER — Ambulatory Visit (INDEPENDENT_AMBULATORY_CARE_PROVIDER_SITE_OTHER): Payer: BC Managed Care – PPO | Admitting: Internal Medicine

## 2014-06-10 VITALS — BP 154/92 | HR 92 | Temp 99.1°F | Resp 16 | Ht 64.5 in | Wt 178.0 lb

## 2014-06-10 DIAGNOSIS — R945 Abnormal results of liver function studies: Secondary | ICD-10-CM

## 2014-06-10 DIAGNOSIS — R7989 Other specified abnormal findings of blood chemistry: Secondary | ICD-10-CM

## 2014-06-10 DIAGNOSIS — J40 Bronchitis, not specified as acute or chronic: Secondary | ICD-10-CM

## 2014-06-10 DIAGNOSIS — R05 Cough: Secondary | ICD-10-CM

## 2014-06-10 DIAGNOSIS — K760 Fatty (change of) liver, not elsewhere classified: Secondary | ICD-10-CM

## 2014-06-10 DIAGNOSIS — R059 Cough, unspecified: Secondary | ICD-10-CM

## 2014-06-10 MED ORDER — ALBUTEROL SULFATE HFA 108 (90 BASE) MCG/ACT IN AERS: INHALATION_SPRAY | RESPIRATORY_TRACT | Status: DC

## 2014-06-10 MED ORDER — BENZONATATE 100 MG PO CAPS: 100.0000 mg | ORAL_CAPSULE | Freq: Two times a day (BID) | ORAL | Status: DC | PRN

## 2014-06-10 NOTE — Patient Instructions (Signed)
Will set up referral to GI MD  See me 30 mins 6 months

## 2014-06-13 ENCOUNTER — Encounter: Payer: Self-pay | Admitting: Gastroenterology

## 2014-07-01 ENCOUNTER — Encounter: Payer: Self-pay | Admitting: Internal Medicine

## 2014-08-14 ENCOUNTER — Ambulatory Visit: Payer: BC Managed Care – PPO | Admitting: Gastroenterology

## 2014-09-23 ENCOUNTER — Ambulatory Visit (INDEPENDENT_AMBULATORY_CARE_PROVIDER_SITE_OTHER): Payer: BC Managed Care – PPO | Admitting: Family Medicine

## 2014-09-23 ENCOUNTER — Telehealth: Payer: Self-pay

## 2014-09-23 ENCOUNTER — Encounter: Payer: Self-pay | Admitting: Family Medicine

## 2014-09-23 VITALS — BP 124/80 | HR 76 | Temp 98.1°F | Ht 65.0 in | Wt 174.8 lb

## 2014-09-23 DIAGNOSIS — R748 Abnormal levels of other serum enzymes: Secondary | ICD-10-CM

## 2014-09-23 DIAGNOSIS — R5383 Other fatigue: Secondary | ICD-10-CM

## 2014-09-23 LAB — HEPATIC FUNCTION PANEL
ALBUMIN: 4.1 g/dL (ref 3.5–5.2)
ALK PHOS: 63 U/L (ref 39–117)
ALT: 36 U/L — ABNORMAL HIGH (ref 0–35)
AST: 24 U/L (ref 0–37)
Bilirubin, Direct: 0.1 mg/dL (ref 0.0–0.3)
TOTAL PROTEIN: 7.2 g/dL (ref 6.0–8.3)
Total Bilirubin: 0.5 mg/dL (ref 0.2–1.2)

## 2014-09-23 LAB — VITAMIN B12: Vitamin B-12: 541 pg/mL (ref 211–911)

## 2014-09-23 NOTE — Telephone Encounter (Signed)
-----   Message from Rosalita Chessman, DO sent at 09/23/2014 11:11 AM EST ----- I would like [facility] to do the ONO to see if she needs formal sleep study--- dx  insomnia

## 2014-09-23 NOTE — Telephone Encounter (Signed)
ONO form completed and faxed.       KP

## 2014-09-23 NOTE — Patient Instructions (Signed)
Preventive Care for Adults A healthy lifestyle and preventive care can promote health and wellness. Preventive health guidelines for women include the following key practices.  A routine yearly physical is a good way to check with your health care provider about your health and preventive screening. It is a chance to share any concerns and updates on your health and to receive a thorough exam.  Visit your dentist for a routine exam and preventive care every 6 months. Brush your teeth twice a day and floss once a day. Good oral hygiene prevents tooth decay and gum disease.  The frequency of eye exams is based on your age, health, family medical history, use of contact lenses, and other factors. Follow your health care provider's recommendations for frequency of eye exams.  Eat a healthy diet. Foods like vegetables, fruits, whole grains, low-fat dairy products, and lean protein foods contain the nutrients you need without too many calories. Decrease your intake of foods high in solid fats, added sugars, and salt. Eat the right amount of calories for you.Get information about a proper diet from your health care provider, if necessary.  Regular physical exercise is one of the most important things you can do for your health. Most adults should get at least 150 minutes of moderate-intensity exercise (any activity that increases your heart rate and causes you to sweat) each week. In addition, most adults need muscle-strengthening exercises on 2 or more days a week.  Maintain a healthy weight. The body mass index (BMI) is a screening tool to identify possible weight problems. It provides an estimate of body fat based on height and weight. Your health care provider can find your BMI and can help you achieve or maintain a healthy weight.For adults 20 years and older:  A BMI below 18.5 is considered underweight.  A BMI of 18.5 to 24.9 is normal.  A BMI of 25 to 29.9 is considered overweight.  A BMI of  30 and above is considered obese.  Maintain normal blood lipids and cholesterol levels by exercising and minimizing your intake of saturated fat. Eat a balanced diet with plenty of fruit and vegetables. Blood tests for lipids and cholesterol should begin at age 76 and be repeated every 5 years. If your lipid or cholesterol levels are high, you are over 50, or you are at high risk for heart disease, you may need your cholesterol levels checked more frequently.Ongoing high lipid and cholesterol levels should be treated with medicines if diet and exercise are not working.  If you smoke, find out from your health care provider how to quit. If you do not use tobacco, do not start.  Lung cancer screening is recommended for adults aged 22-80 years who are at high risk for developing lung cancer because of a history of smoking. A yearly low-dose CT scan of the lungs is recommended for people who have at least a 30-pack-year history of smoking and are a current smoker or have quit within the past 15 years. A pack year of smoking is smoking an average of 1 pack of cigarettes a day for 1 year (for example: 1 pack a day for 30 years or 2 packs a day for 15 years). Yearly screening should continue until the smoker has stopped smoking for at least 15 years. Yearly screening should be stopped for people who develop a health problem that would prevent them from having lung cancer treatment.  If you are pregnant, do not drink alcohol. If you are breastfeeding,  be very cautious about drinking alcohol. If you are not pregnant and choose to drink alcohol, do not have more than 1 drink per day. One drink is considered to be 12 ounces (355 mL) of beer, 5 ounces (148 mL) of wine, or 1.5 ounces (44 mL) of liquor.  Avoid use of street drugs. Do not share needles with anyone. Ask for help if you need support or instructions about stopping the use of drugs.  High blood pressure causes heart disease and increases the risk of  stroke. Your blood pressure should be checked at least every 1 to 2 years. Ongoing high blood pressure should be treated with medicines if weight loss and exercise do not work.  If you are 75-52 years old, ask your health care provider if you should take aspirin to prevent strokes.  Diabetes screening involves taking a blood sample to check your fasting blood sugar level. This should be done once every 3 years, after age 15, if you are within normal weight and without risk factors for diabetes. Testing should be considered at a younger age or be carried out more frequently if you are overweight and have at least 1 risk factor for diabetes.  Breast cancer screening is essential preventive care for women. You should practice "breast self-awareness." This means understanding the normal appearance and feel of your breasts and may include breast self-examination. Any changes detected, no matter how small, should be reported to a health care provider. Women in their 58s and 30s should have a clinical breast exam (CBE) by a health care provider as part of a regular health exam every 1 to 3 years. After age 16, women should have a CBE every year. Starting at age 53, women should consider having a mammogram (breast X-ray test) every year. Women who have a family history of breast cancer should talk to their health care provider about genetic screening. Women at a high risk of breast cancer should talk to their health care providers about having an MRI and a mammogram every year.  Breast cancer gene (BRCA)-related cancer risk assessment is recommended for women who have family members with BRCA-related cancers. BRCA-related cancers include breast, ovarian, tubal, and peritoneal cancers. Having family members with these cancers may be associated with an increased risk for harmful changes (mutations) in the breast cancer genes BRCA1 and BRCA2. Results of the assessment will determine the need for genetic counseling and  BRCA1 and BRCA2 testing.  Routine pelvic exams to screen for cancer are no longer recommended for nonpregnant women who are considered low risk for cancer of the pelvic organs (ovaries, uterus, and vagina) and who do not have symptoms. Ask your health care provider if a screening pelvic exam is right for you.  If you have had past treatment for cervical cancer or a condition that could lead to cancer, you need Pap tests and screening for cancer for at least 20 years after your treatment. If Pap tests have been discontinued, your risk factors (such as having a new sexual partner) need to be reassessed to determine if screening should be resumed. Some women have medical problems that increase the chance of getting cervical cancer. In these cases, your health care provider may recommend more frequent screening and Pap tests.  The HPV test is an additional test that may be used for cervical cancer screening. The HPV test looks for the virus that can cause the cell changes on the cervix. The cells collected during the Pap test can be  tested for HPV. The HPV test could be used to screen women aged 30 years and older, and should be used in women of any age who have unclear Pap test results. After the age of 30, women should have HPV testing at the same frequency as a Pap test.  Colorectal cancer can be detected and often prevented. Most routine colorectal cancer screening begins at the age of 50 years and continues through age 75 years. However, your health care provider may recommend screening at an earlier age if you have risk factors for colon cancer. On a yearly basis, your health care provider may provide home test kits to check for hidden blood in the stool. Use of a small camera at the end of a tube, to directly examine the colon (sigmoidoscopy or colonoscopy), can detect the earliest forms of colorectal cancer. Talk to your health care provider about this at age 50, when routine screening begins. Direct  exam of the colon should be repeated every 5-10 years through age 75 years, unless early forms of pre-cancerous polyps or small growths are found.  People who are at an increased risk for hepatitis B should be screened for this virus. You are considered at high risk for hepatitis B if:  You were born in a country where hepatitis B occurs often. Talk with your health care provider about which countries are considered high risk.  Your parents were born in a high-risk country and you have not received a shot to protect against hepatitis B (hepatitis B vaccine).  You have HIV or AIDS.  You use needles to inject street drugs.  You live with, or have sex with, someone who has hepatitis B.  You get hemodialysis treatment.  You take certain medicines for conditions like cancer, organ transplantation, and autoimmune conditions.  Hepatitis C blood testing is recommended for all people born from 1945 through 1965 and any individual with known risks for hepatitis C.  Practice safe sex. Use condoms and avoid high-risk sexual practices to reduce the spread of sexually transmitted infections (STIs). STIs include gonorrhea, chlamydia, syphilis, trichomonas, herpes, HPV, and human immunodeficiency virus (HIV). Herpes, HIV, and HPV are viral illnesses that have no cure. They can result in disability, cancer, and death.  You should be screened for sexually transmitted illnesses (STIs) including gonorrhea and chlamydia if:  You are sexually active and are younger than 24 years.  You are older than 24 years and your health care provider tells you that you are at risk for this type of infection.  Your sexual activity has changed since you were last screened and you are at an increased risk for chlamydia or gonorrhea. Ask your health care provider if you are at risk.  If you are at risk of being infected with HIV, it is recommended that you take a prescription medicine daily to prevent HIV infection. This is  called preexposure prophylaxis (PrEP). You are considered at risk if:  You are a heterosexual woman, are sexually active, and are at increased risk for HIV infection.  You take drugs by injection.  You are sexually active with a partner who has HIV.  Talk with your health care provider about whether you are at high risk of being infected with HIV. If you choose to begin PrEP, you should first be tested for HIV. You should then be tested every 3 months for as long as you are taking PrEP.  Osteoporosis is a disease in which the bones lose minerals and strength   with aging. This can result in serious bone fractures or breaks. The risk of osteoporosis can be identified using a bone density scan. Women ages 65 years and over and women at risk for fractures or osteoporosis should discuss screening with their health care providers. Ask your health care provider whether you should take a calcium supplement or vitamin D to reduce the rate of osteoporosis.  Menopause can be associated with physical symptoms and risks. Hormone replacement therapy is available to decrease symptoms and risks. You should talk to your health care provider about whether hormone replacement therapy is right for you.  Use sunscreen. Apply sunscreen liberally and repeatedly throughout the day. You should seek shade when your shadow is shorter than you. Protect yourself by wearing long sleeves, pants, a wide-brimmed hat, and sunglasses year round, whenever you are outdoors.  Once a month, do a whole body skin exam, using a mirror to look at the skin on your back. Tell your health care provider of new moles, moles that have irregular borders, moles that are larger than a pencil eraser, or moles that have changed in shape or color.  Stay current with required vaccines (immunizations).  Influenza vaccine. All adults should be immunized every year.  Tetanus, diphtheria, and acellular pertussis (Td, Tdap) vaccine. Pregnant women should  receive 1 dose of Tdap vaccine during each pregnancy. The dose should be obtained regardless of the length of time since the last dose. Immunization is preferred during the 27th-36th week of gestation. An adult who has not previously received Tdap or who does not know her vaccine status should receive 1 dose of Tdap. This initial dose should be followed by tetanus and diphtheria toxoids (Td) booster doses every 10 years. Adults with an unknown or incomplete history of completing a 3-dose immunization series with Td-containing vaccines should begin or complete a primary immunization series including a Tdap dose. Adults should receive a Td booster every 10 years.  Varicella vaccine. An adult without evidence of immunity to varicella should receive 2 doses or a second dose if she has previously received 1 dose. Pregnant females who do not have evidence of immunity should receive the first dose after pregnancy. This first dose should be obtained before leaving the health care facility. The second dose should be obtained 4-8 weeks after the first dose.  Human papillomavirus (HPV) vaccine. Females aged 13-26 years who have not received the vaccine previously should obtain the 3-dose series. The vaccine is not recommended for use in pregnant females. However, pregnancy testing is not needed before receiving a dose. If a female is found to be pregnant after receiving a dose, no treatment is needed. In that case, the remaining doses should be delayed until after the pregnancy. Immunization is recommended for any person with an immunocompromised condition through the age of 26 years if she did not get any or all doses earlier. During the 3-dose series, the second dose should be obtained 4-8 weeks after the first dose. The third dose should be obtained 24 weeks after the first dose and 16 weeks after the second dose.  Zoster vaccine. One dose is recommended for adults aged 60 years or older unless certain conditions are  present.  Measles, mumps, and rubella (MMR) vaccine. Adults born before 1957 generally are considered immune to measles and mumps. Adults born in 1957 or later should have 1 or more doses of MMR vaccine unless there is a contraindication to the vaccine or there is laboratory evidence of immunity to   each of the three diseases. A routine second dose of MMR vaccine should be obtained at least 28 days after the first dose for students attending postsecondary schools, health care workers, or international travelers. People who received inactivated measles vaccine or an unknown type of measles vaccine during 1963-1967 should receive 2 doses of MMR vaccine. People who received inactivated mumps vaccine or an unknown type of mumps vaccine before 1979 and are at high risk for mumps infection should consider immunization with 2 doses of MMR vaccine. For females of childbearing age, rubella immunity should be determined. If there is no evidence of immunity, females who are not pregnant should be vaccinated. If there is no evidence of immunity, females who are pregnant should delay immunization until after pregnancy. Unvaccinated health care workers born before 1957 who lack laboratory evidence of measles, mumps, or rubella immunity or laboratory confirmation of disease should consider measles and mumps immunization with 2 doses of MMR vaccine or rubella immunization with 1 dose of MMR vaccine.  Pneumococcal 13-valent conjugate (PCV13) vaccine. When indicated, a person who is uncertain of her immunization history and has no record of immunization should receive the PCV13 vaccine. An adult aged 19 years or older who has certain medical conditions and has not been previously immunized should receive 1 dose of PCV13 vaccine. This PCV13 should be followed with a dose of pneumococcal polysaccharide (PPSV23) vaccine. The PPSV23 vaccine dose should be obtained at least 8 weeks after the dose of PCV13 vaccine. An adult aged 19  years or older who has certain medical conditions and previously received 1 or more doses of PPSV23 vaccine should receive 1 dose of PCV13. The PCV13 vaccine dose should be obtained 1 or more years after the last PPSV23 vaccine dose.  Pneumococcal polysaccharide (PPSV23) vaccine. When PCV13 is also indicated, PCV13 should be obtained first. All adults aged 65 years and older should be immunized. An adult younger than age 65 years who has certain medical conditions should be immunized. Any person who resides in a nursing home or long-term care facility should be immunized. An adult smoker should be immunized. People with an immunocompromised condition and certain other conditions should receive both PCV13 and PPSV23 vaccines. People with human immunodeficiency virus (HIV) infection should be immunized as soon as possible after diagnosis. Immunization during chemotherapy or radiation therapy should be avoided. Routine use of PPSV23 vaccine is not recommended for American Indians, [place] Natives, or people younger than 65 years unless there are medical conditions that require PPSV23 vaccine. When indicated, people who have unknown immunization and have no record of immunization should receive PPSV23 vaccine. One-time revaccination 5 years after the first dose of PPSV23 is recommended for people aged 19-64 years who have chronic kidney failure, nephrotic syndrome, asplenia, or immunocompromised conditions. People who received 1-2 doses of PPSV23 before age 65 years should receive another dose of PPSV23 vaccine at age 65 years or later if at least 5 years have passed since the previous dose. Doses of PPSV23 are not needed for people immunized with PPSV23 at or after age 65 years.  Meningococcal vaccine. Adults with asplenia or persistent complement component deficiencies should receive 2 doses of quadrivalent meningococcal conjugate (MenACWY-D) vaccine. The doses should be obtained at least 2 months apart.  Microbiologists working with certain meningococcal bacteria, military recruits, people at risk during an outbreak, and people who travel to or live in countries with a high rate of meningitis should be immunized. A first-year college student up through age   21 years who is living in a residence hall should receive a dose if she did not receive a dose on or after her 16th birthday. Adults who have certain high-risk conditions should receive one or more doses of vaccine.  Hepatitis A vaccine. Adults who wish to be protected from this disease, have certain high-risk conditions, work with hepatitis A-infected animals, work in hepatitis A research labs, or travel to or work in countries with a high rate of hepatitis A should be immunized. Adults who were previously unvaccinated and who anticipate close contact with an international adoptee during the first 60 days after arrival in the [place] from a country with a high rate of hepatitis A should be immunized.  Hepatitis B vaccine. Adults who wish to be protected from this disease, have certain high-risk conditions, may be exposed to blood or other infectious body fluids, are household contacts or sex partners of hepatitis B positive people, are clients or workers in certain care facilities, or travel to or work in countries with a high rate of hepatitis B should be immunized.  Haemophilus influenzae type b (Hib) vaccine. A previously unvaccinated person with asplenia or sickle cell disease or having a scheduled splenectomy should receive 1 dose of Hib vaccine. Regardless of previous immunization, a recipient of a hematopoietic stem cell transplant should receive a 3-dose series 6-12 months after her successful transplant. Hib vaccine is not recommended for adults with HIV infection. Preventive Services / Frequency Ages 64 to 68 years  Blood pressure check.** / Every 1 to 2 years.  Lipid and cholesterol check.** / Every 5 years beginning at age  22.  Clinical breast exam.** / Every 3 years for women in their 88s and 53s.  BRCA-related cancer risk assessment.** / For women who have family members with a BRCA-related cancer (breast, ovarian, tubal, or peritoneal cancers).  Pap test.** / Every 2 years from ages 90 through 51. Every 3 years starting at age 21 through age 56 or 3 with a history of 3 consecutive normal Pap tests.  HPV screening.** / Every 3 years from ages 24 through ages 1 to 46 with a history of 3 consecutive normal Pap tests.  Hepatitis C blood test.** / For any individual with known risks for hepatitis C.  Skin self-exam. / Monthly.  Influenza vaccine. / Every year.  Tetanus, diphtheria, and acellular pertussis (Tdap, Td) vaccine.** / Consult your health care provider. Pregnant women should receive 1 dose of Tdap vaccine during each pregnancy. 1 dose of Td every 10 years.  Varicella vaccine.** / Consult your health care provider. Pregnant females who do not have evidence of immunity should receive the first dose after pregnancy.  HPV vaccine. / 3 doses over 6 months, if 72 and younger. The vaccine is not recommended for use in pregnant females. However, pregnancy testing is not needed before receiving a dose.  Measles, mumps, rubella (MMR) vaccine.** / You need at least 1 dose of MMR if you were born in 1957 or later. You may also need a 2nd dose. For females of childbearing age, rubella immunity should be determined. If there is no evidence of immunity, females who are not pregnant should be vaccinated. If there is no evidence of immunity, females who are pregnant should delay immunization until after pregnancy.  Pneumococcal 13-valent conjugate (PCV13) vaccine.** / Consult your health care provider.  Pneumococcal polysaccharide (PPSV23) vaccine.** / 1 to 2 doses if you smoke cigarettes or if you have certain conditions.  Meningococcal vaccine.** /  1 dose if you are age 19 to 21 years and a first-year college  student living in a residence hall, or have one of several medical conditions, you need to get vaccinated against meningococcal disease. You may also need additional booster doses.  Hepatitis A vaccine.** / Consult your health care provider.  Hepatitis B vaccine.** / Consult your health care provider.  Haemophilus influenzae type b (Hib) vaccine.** / Consult your health care provider. Ages 40 to 64 years  Blood pressure check.** / Every 1 to 2 years.  Lipid and cholesterol check.** / Every 5 years beginning at age 20 years.  Lung cancer screening. / Every year if you are aged 55-80 years and have a 30-pack-year history of smoking and currently smoke or have quit within the past 15 years. Yearly screening is stopped once you have quit smoking for at least 15 years or develop a health problem that would prevent you from having lung cancer treatment.  Clinical breast exam.** / Every year after age 40 years.  BRCA-related cancer risk assessment.** / For women who have family members with a BRCA-related cancer (breast, ovarian, tubal, or peritoneal cancers).  Mammogram.** / Every year beginning at age 40 years and continuing for as long as you are in good health. Consult with your health care provider.  Pap test.** / Every 3 years starting at age 30 years through age 65 or 70 years with a history of 3 consecutive normal Pap tests.  HPV screening.** / Every 3 years from ages 30 years through ages 65 to 70 years with a history of 3 consecutive normal Pap tests.  Fecal occult blood test (FOBT) of stool. / Every year beginning at age 50 years and continuing until age 75 years. You may not need to do this test if you get a colonoscopy every 10 years.  Flexible sigmoidoscopy or colonoscopy.** / Every 5 years for a flexible sigmoidoscopy or every 10 years for a colonoscopy beginning at age 50 years and continuing until age 75 years.  Hepatitis C blood test.** / For all people born from 1945 through  1965 and any individual with known risks for hepatitis C.  Skin self-exam. / Monthly.  Influenza vaccine. / Every year.  Tetanus, diphtheria, and acellular pertussis (Tdap/Td) vaccine.** / Consult your health care provider. Pregnant women should receive 1 dose of Tdap vaccine during each pregnancy. 1 dose of Td every 10 years.  Varicella vaccine.** / Consult your health care provider. Pregnant females who do not have evidence of immunity should receive the first dose after pregnancy.  Zoster vaccine.** / 1 dose for adults aged 60 years or older.  Measles, mumps, rubella (MMR) vaccine.** / You need at least 1 dose of MMR if you were born in 1957 or later. You may also need a 2nd dose. For females of childbearing age, rubella immunity should be determined. If there is no evidence of immunity, females who are not pregnant should be vaccinated. If there is no evidence of immunity, females who are pregnant should delay immunization until after pregnancy.  Pneumococcal 13-valent conjugate (PCV13) vaccine.** / Consult your health care provider.  Pneumococcal polysaccharide (PPSV23) vaccine.** / 1 to 2 doses if you smoke cigarettes or if you have certain conditions.  Meningococcal vaccine.** / Consult your health care provider.  Hepatitis A vaccine.** / Consult your health care provider.  Hepatitis B vaccine.** / Consult your health care provider.  Haemophilus influenzae type b (Hib) vaccine.** / Consult your health care provider. Ages 65   years and over  Blood pressure check.** / Every 1 to 2 years.  Lipid and cholesterol check.** / Every 5 years beginning at age 22 years.  Lung cancer screening. / Every year if you are aged 73-80 years and have a 30-pack-year history of smoking and currently smoke or have quit within the past 15 years. Yearly screening is stopped once you have quit smoking for at least 15 years or develop a health problem that would prevent you from having lung cancer  treatment.  Clinical breast exam.** / Every year after age 4 years.  BRCA-related cancer risk assessment.** / For women who have family members with a BRCA-related cancer (breast, ovarian, tubal, or peritoneal cancers).  Mammogram.** / Every year beginning at age 40 years and continuing for as long as you are in good health. Consult with your health care provider.  Pap test.** / Every 3 years starting at age 9 years through age 34 or 91 years with 3 consecutive normal Pap tests. Testing can be stopped between 65 and 70 years with 3 consecutive normal Pap tests and no abnormal Pap or HPV tests in the past 10 years.  HPV screening.** / Every 3 years from ages 57 years through ages 64 or 45 years with a history of 3 consecutive normal Pap tests. Testing can be stopped between 65 and 70 years with 3 consecutive normal Pap tests and no abnormal Pap or HPV tests in the past 10 years.  Fecal occult blood test (FOBT) of stool. / Every year beginning at age 15 years and continuing until age 17 years. You may not need to do this test if you get a colonoscopy every 10 years.  Flexible sigmoidoscopy or colonoscopy.** / Every 5 years for a flexible sigmoidoscopy or every 10 years for a colonoscopy beginning at age 86 years and continuing until age 71 years.  Hepatitis C blood test.** / For all people born from [birth year] through 1967 and any individual with known risks for hepatitis C.  Osteoporosis screening.** / A one-time screening for women ages 83 years and over and women at risk for fractures or osteoporosis.  Skin self-exam. / Monthly.  Influenza vaccine. / Every year.  Tetanus, diphtheria, and acellular pertussis (Tdap/Td) vaccine.** / 1 dose of Td every 10 years.  Varicella vaccine.** / Consult your health care provider.  Zoster vaccine.** / 1 dose for adults aged 61 years or older.  Pneumococcal 13-valent conjugate (PCV13) vaccine.** / Consult your health care provider.  Pneumococcal  polysaccharide (PPSV23) vaccine.** / 1 dose for all adults aged 28 years and older.  Meningococcal vaccine.** / Consult your health care provider.  Hepatitis A vaccine.** / Consult your health care provider.  Hepatitis B vaccine.** / Consult your health care provider.  Haemophilus influenzae type b (Hib) vaccine.** / Consult your health care provider. ** Family history and personal history of risk and conditions may change your health care provider's recommendations. Document Released: 10/12/2001 Document Revised: 12/31/2013 Document Reviewed: 01/11/2011 [facility] Patient Information 2015 [place], [place]. This information is not intended to replace advice given to you by your health care provider. Make sure you discuss any questions you have with your health care provider.

## 2014-09-23 NOTE — Progress Notes (Signed)
Pre visit review using our clinic review tool, if applicable. No additional management support is needed unless otherwise documented below in the visit note. 

## 2014-09-23 NOTE — Progress Notes (Signed)
Subjective:     Jacqueline Calhoun is a 39 y.o. female and is here for a comprehensive physical exam. The patient reports problems - pt has multiple problems--- her lft have been elevated and she has been on a modified gluten free diet which has helped. She is struggling with muscle aches and fatigue.  She has trouble staying asleep at night.  She also c/o of some constipation.  She has had labs done by ob/gyn that were normal-- (they are to be scanned in) but the patient thinks she must have a thyroid problem because  Of family hx and her symptoms.     History   Social History  . Marital Status: Married    Spouse Name: N/A    Number of Children: N/A  . Years of Education: N/A   Occupational History  . Not on file.   Social History Main Topics  . Smoking status: Never Smoker   . Smokeless tobacco: Never Used  . Alcohol Use: 1.2 oz/week    2 Shots of liquor per week  . Drug Use: No  . Sexual Activity: Yes    Birth Control/ Protection: Condom   Other Topics Concern  . Not on file   Social History Narrative   Health Maintenance  Topic Date Due  . Samul Dada  03/17/1993  . INFLUENZA VACCINE  09/24/2015 (Originally 03/30/2014)  . MAMMOGRAM  07/18/2015  . PAP SMEAR  05/15/2016    The following portions of the patient's history were reviewed and updated as appropriate:  She  has a past medical history of IBS (irritable bowel syndrome); Anxiety; and Hypertension. She  does not have any pertinent problems on file. She  has no past surgical history on file. Her family history includes Alzheimer's disease in her paternal grandmother; Anxiety disorder in her father; Cancer in her maternal aunt and paternal aunt; Cancer (age of onset: 76) in her paternal grandfather; Heart disease (age of onset: 78) in her maternal grandfather and maternal grandmother; Hypertension in her father and mother; Thyroid disease in her paternal aunt and paternal grandmother. She  reports that she has never smoked.  She has never used smokeless tobacco. She reports that she drinks about 1.2 oz of alcohol per week. She reports that she does not use illicit drugs. She has a current medication list which includes the following prescription(s): b-complex with vitamin c, vitamin d, flaxseed (linseed), losartan, multivitamin, norgestrel-ethinyl estradiol, and lactobacillus. Current Outpatient Prescriptions on File Prior to Visit  Medication Sig Dispense Refill  . B Complex-C (B-COMPLEX WITH VITAMIN C) tablet Take 1 tablet by mouth daily.    Marland Kitchen losartan (COZAAR) 50 MG tablet Take 1 tablet (50 mg total) by mouth daily. 90 tablet 0  . Multiple Vitamin (MULTIVITAMIN) tablet Take 1 tablet by mouth daily.      . norgestrel-ethinyl estradiol (LO/OVRAL,CRYSELLE) 0.3-30 MG-MCG tablet Take 1 tablet by mouth daily.    . Probiotic Product (PROBIOTIC ACIDOPHILUS PO) Take 1 tablet by mouth daily.       No current facility-administered medications on file prior to visit.   She has No Known Allergies..  Review of Systems Review of Systems  Constitutional: Negative for activity change, appetite change  + fatigue  HENT: Negative for hearing loss, congestion, tinnitus and ear discharge.  dentist q79m Eyes: Negative for visual disturbance (see optho q1y -- vision corrected to 20/20 with glasses).  Respiratory: Negative for cough, chest tightness and shortness of breath.   Cardiovascular: Negative for chest pain, palpitations and  leg swelling.  Gastrointestinal: Negative for abdominal pain, diarrhea, and abdominal distention. + constipation Genitourinary: Negative for urgency, frequency, decreased urine volume and difficulty urinating.  Musculoskeletal: Negative for back pain,and gait problem. + muscle aches Skin: Negative for color change, pallor and rash.  Neurological: Negative for dizziness, light-headedness, numbness and headaches.  Hematological: Negative for adenopathy. Does not bruise/bleed easily.   Psychiatric/Behavioral: Negative for suicidal ideas, confusion, self-injury, dysphoric mood, decreased concentration and agitation.  + insomnia-- pt can fall asleep but can not stay asleep.        Objective:    BP 124/80 mmHg  Pulse 76  Temp(Src) 98.1 F (36.7 C) (Oral)  Ht 5\' 5"  (1.651 m)  Wt 174 lb 12.8 oz (79.289 kg)  BMI 29.09 kg/m2  SpO2 99%  LMP 09/02/2014 (Approximate) General appearance: alert, cooperative, appears stated age and no distress Head: Normocephalic, without obvious abnormality, atraumatic Eyes: negative findings: lids and lashes normal, conjunctivae and sclerae normal and pupils equal, round, reactive to light and accomodation Ears: normal TM's and external ear canals both ears Nose: Nares normal. Septum midline. Mucosa normal. No drainage or sinus tenderness. Throat: lips, mucosa, and tongue normal; teeth and gums normal Neck: no adenopathy, no carotid bruit, no JVD, supple, symmetrical, trachea midline and thyroid not enlarged, symmetric, no tenderness/mass/nodules Back: symmetric, no curvature. ROM normal. No CVA tenderness. Lungs: clear to auscultation bilaterally Breasts: gyn Heart: regular rate and rhythm, S1, S2 normal, no murmur, click, rub or gallop Abdomen: soft, non-tender; bowel sounds normal; no masses,  no organomegaly Pelvic: deferred--gyn Extremities: extremities normal, atraumatic, no cyanosis or edema Pulses: 2+ and symmetric Skin: Skin color, texture, turgor normal. No rashes or lesions Lymph nodes: Cervical, supraclavicular, and axillary nodes normal. Neurologic: Alert and oriented X 3, normal strength and tone. Normal symmetric reflexes. Normal coordination and gait Psych-- no anxiety, depression per pt      Assessment:    Healthy female exam.      Plan:    ghm utd Check labs See After Visit Summary for Counseling Recommendations    1. Other fatigue Check labs--- other labs from gyn normal con't gluten free diet ---esp if  you feel better on it - Thyroid antibodies  - B12  2. Elevated liver enzymes Recheck today - Hepatic function panel   3.  Insomnia--  Will get ONO to see if she may need pulm referral for sleep study

## 2014-09-24 ENCOUNTER — Other Ambulatory Visit: Payer: Self-pay | Admitting: Family Medicine

## 2014-09-24 DIAGNOSIS — R7989 Other specified abnormal findings of blood chemistry: Secondary | ICD-10-CM

## 2014-09-24 LAB — THYROID ANTIBODIES
THYROID PEROXIDASE ANTIBODY: 6 [IU]/mL (ref <9)
Thyroglobulin Ab: 5 IU/mL — ABNORMAL HIGH (ref <2)

## 2014-09-26 ENCOUNTER — Encounter: Payer: Self-pay | Admitting: Family Medicine

## 2014-09-30 ENCOUNTER — Other Ambulatory Visit: Payer: Self-pay | Admitting: Internal Medicine

## 2014-09-30 NOTE — Telephone Encounter (Signed)
Refill request

## 2014-10-08 ENCOUNTER — Encounter: Payer: Self-pay | Admitting: Internal Medicine

## 2014-10-08 ENCOUNTER — Ambulatory Visit (INDEPENDENT_AMBULATORY_CARE_PROVIDER_SITE_OTHER): Payer: BC Managed Care – PPO | Admitting: Internal Medicine

## 2014-10-08 VITALS — BP 138/80 | HR 88 | Temp 98.0°F | Resp 12 | Ht 64.5 in | Wt 175.0 lb

## 2014-10-08 DIAGNOSIS — E063 Autoimmune thyroiditis: Secondary | ICD-10-CM

## 2014-10-08 LAB — TSH: TSH: 1.07 u[IU]/mL (ref 0.35–4.50)

## 2014-10-08 LAB — T4, FREE: FREE T4: 0.78 ng/dL (ref 0.60–1.60)

## 2014-10-08 LAB — T3, FREE: T3, Free: 3.2 pg/mL (ref 2.3–4.2)

## 2014-10-08 NOTE — Progress Notes (Signed)
Patient ID: Jacqueline Calhoun, female   DOB: [birth year], 39 y.o.   MRN: [id number]   HPI  Jacqueline Calhoun is a 39 y.o.-year-old female, referred by her PCP, Dr. Etter Sjogren, for management of Hashimoto's thyroiditis.  Pt. has been dx with Hashimoto's thyroiditis in 08/2014 during investigation for several complaints >> see below.  She described CP in 2012  >> anxiety, HTN >> started anxiety meds and BP meds, but sxs not better. She also had depression; last year also started to have night sweats, now better. She wakes up early in am, does not sleep well; also has dry skin. Menses: after her baby in 2007 >> menorrhagia >> saw ObGyn >> different images tests >> thick endometrium. In 2014 she had an ovarian cyst >> pain >> started OCPs.   Pt also describes: - + weight gain >> 18 lbs in last 3.5 years - + fatigue - pm "crash" disappeared after gluten elim. - + cold and heat intolerance - no diarrhea/+ occas.constipation - + dry skin - + hair graying  I reviewed pt's thyroid tests: 04/23/2014 (ObGyn): TSH 0.93, fT4 0.96, fT3 3.2 Lab Results  Component Value Date   TSH 1.353 10/30/2012   TSH 1.099 07/20/2011   FREET4 1.13 10/30/2012   Component Date Value Ref Range  . Thyroid Peroxidase Antibody 09/23/2014 6  <9 IU/mL  . Thyroglobulin Ab 09/23/2014 5* <2 IU/mL   Pt denies feeling nodules in neck, hoarseness, dysphagia/odynophagia, SOB with lying down. Has occasional choking.  She has + FH of thyroid disorders in: GM and aunts. No FH of thyroid cancer.  No h/o radiation tx to head or neck. No recent use of iodine supplements.  She also has a h/o HTN and elevated LFTs >> normalized after starting a gluten-free diet.  No regular exercise.  ROS: Constitutional: + see HPI Eyes: no blurry vision, no xerophthalmia ENT: no sore throat, no nodules palpated in throat, no dysphagia/odynophagia, no hoarseness Cardiovascular: no CP/SOB/+ palpitations/no leg swelling Respiratory: + cough/no  SOB Gastrointestinal: no N/V/D/+ C/+ heartburn Musculoskeletal: + muscle aches/no joint aches Skin: no rashes, + itching, + easy bruising Neurological: no tremors/numbness/tingling/dizziness Psychiatric: + both: depression/anxiety + low libido  Past Medical History  Diagnosis Date  . IBS (irritable bowel syndrome)   . Anxiety   . Hypertension    No past surgical history on file.   History   Social History  . Marital Status: Married    Spouse Name: N/A    Number of Children: 1   Occupational History  . teacher   Social History Main Topics  . Smoking status: Never Smoker   . Smokeless tobacco: Never Used  . Alcohol Use: 1.2 oz/week    1-2 Shots of liquor per week  . Drug Use: No  . Sexual Activity: Yes    Birth Control/ Protection: Condom   Current Outpatient Prescriptions on File Prior to Visit  Medication Sig Dispense Refill  . B Complex-C (B-COMPLEX WITH VITAMIN C) tablet Take 1 tablet by mouth daily.    . Cholecalciferol (VITAMIN D) 2000 UNITS CAPS Take 1 capsule by mouth daily.    . Flaxseed, Linseed, (FLAX SEED OIL PO) Take 1 tablet by mouth daily.    Marland Kitchen losartan (COZAAR) 50 MG tablet Take 1 tablet (50 mg total) by mouth daily. 90 tablet 0  . Multiple Vitamin (MULTIVITAMIN) tablet Take 1 tablet by mouth daily.      . Probiotic Product (PROBIOTIC ACIDOPHILUS PO) Take 1 tablet by mouth daily.  No current facility-administered medications on file prior to visit.   No Known Allergies   Family History  Problem Relation Age of Onset  . Hypertension Mother   . Hypertension Father   . Anxiety disorder Father   . Heart disease Maternal Grandmother 53    MI  . Heart disease Maternal Grandfather 82    MI  . Thyroid disease Paternal Aunt   . Cancer Paternal Aunt     breast  . Cancer Maternal Aunt     colon  . Alzheimer's disease Paternal Grandmother   . Thyroid disease Paternal Grandmother   . Cancer Paternal Grandfather 68    PE: BP 138/80 mmHg  Pulse  88  Temp(Src) 98 F (36.7 C) (Oral)  Resp 12  Ht 5' 4.5" (1.638 m)  Wt 175 lb (79.379 kg)  BMI 29.59 kg/m2  SpO2 96%  LMP 09/02/2014 (Approximate) Wt Readings from Last 3 Encounters:  10/08/14 175 lb (79.379 kg)  09/23/14 174 lb 12.8 oz (79.289 kg)  06/10/14 178 lb (80.74 kg)   Constitutional: overweight, in NAD Eyes: PERRLA, EOMI, no exophthalmos ENT: moist mucous membranes, no thyromegaly, no cervical lymphadenopathy Cardiovascular: RRR, No MRG Respiratory: CTA B Gastrointestinal: abdomen soft, NT, ND, BS+ Musculoskeletal: no deformities, strength intact in all 4 Skin: moist, warm, no rashes Neurological: no tremor with outstretched hands, DTR normal in all 4  ASSESSMENT: 1. Hashimoto thyroiditis - normal TPO Abs - slightly high ATA  PLAN: 1. Hashimoto thyroiditis - we had a long discussion about her Hashimoto thyroiditis diagnosis. I explained that this is an autoimmune disorder, in which she develops antibodies against her own thyroid. The antibodies bind to the thyroid tissue and cause inflammation, and, eventually, destruction of the gland and hypothyroidism. We don't know how long this process can be, it can last from months to years. As of now, based on the last results that I have, her thyroid tests are normal. We will repeat them today, however. We reviewed her recent thyroid antibody levels and the TPO Abs were normal, while the ATA were minimally elevated. - I also explained that thyroid enlargement especially at the beginning of her Hashimoto thyroiditis course is not uncommon, and it has a waxing and waning character. She has some choking, but I do not feel her thyroid enlarged or any nodules at palpation. If the choking persists, we can check a thyroid U/S. - We discussed about treatment for Hashimoto thyroiditis, which is actually limited to thyroid hormones in case her TFTs are abnormal. Supplements like selenium has been tried with various results, some showing  improvement in the TPO antibodies. However, there are no randomized controlled trials of this are consistent results between trials. We also discussed about ways to improve her immune system (relaxation, diet, exercise, sleep) to reduce the Ab titer and, subsequently, the thyroid inflammation. - We decided to check thyroid tests now and have her return in 6 mo for repeat. However, she should let me know if she develops more neck compression symptoms, in that case, we might need to repeat a thyroid U/S or a Ba swallow test  Office Visit on 10/08/2014  Component Date Value Ref Range Status  . TSH 10/08/2014 1.07  0.35 - 4.50 uIU/mL Final  . Free T4 10/08/2014 0.78  0.60 - 1.60 ng/dL Final  . T3, Free 10/08/2014 3.2  2.3 - 4.2 pg/mL Final   Thyroid tests are great. No intervention needed for now.

## 2014-10-08 NOTE — Patient Instructions (Signed)
Please stop at the lab.  Please come back for a follow-up appointment in 6 months.  

## 2014-10-13 ENCOUNTER — Encounter: Payer: Self-pay | Admitting: Internal Medicine

## 2014-10-16 ENCOUNTER — Other Ambulatory Visit: Payer: Self-pay | Admitting: Internal Medicine

## 2014-10-16 NOTE — Telephone Encounter (Signed)
Refill request

## 2014-10-17 ENCOUNTER — Encounter: Payer: Self-pay | Admitting: Family Medicine

## 2014-10-17 MED ORDER — LOSARTAN POTASSIUM 50 MG PO TABS: 50.0000 mg | ORAL_TABLET | Freq: Every day | ORAL | Status: DC

## 2014-12-16 ENCOUNTER — Ambulatory Visit: Payer: BC Managed Care – PPO | Admitting: Internal Medicine

## 2015-01-19 ENCOUNTER — Telehealth: Payer: BC Managed Care – PPO | Admitting: Physician Assistant

## 2015-01-19 DIAGNOSIS — K529 Noninfective gastroenteritis and colitis, unspecified: Secondary | ICD-10-CM

## 2015-01-19 MED ORDER — CIPROFLOXACIN HCL 500 MG PO TABS: 500.0000 mg | ORAL_TABLET | Freq: Two times a day (BID) | ORAL | Status: DC

## 2015-01-19 NOTE — Progress Notes (Signed)
We are sorry that you are not feeling well.  Here is how we plan to help!  Based on what you have shared with me it looks like you have Acute Infectious Diarrhea.  Most cases of acute diarrhea are due to infections with virus and bacteria and are self-limited conditions lasting less than 14 days.  For your symptoms you may take Imodium 2 mg tablets that are over the counter at your local pharmacy. Take two tablet now and then one after each loose stool up to 6 a day.  Antibiotics are not needed for most people with diarrhea.  However giving recent foreign travel, I will start you on an antibiotic for concerns of bacterial gastroenteritis.  Increase your fluid intake as you need to stay hydrated.  This will also help kidneys flush out the medications you are taking to prevent toxicity.  I think it is reasonable for you to speak with an Allergist or your Primary Doctor regarding gluten sensitivity evaluation.  If your urine does not lighten with increased hydration, please go to an Urgent Care or ER as you may need IV fluids.  I have sent in Cipro 500 mg two tablets twice a day for five days.  HOME CARE  We recommend changing your diet to help with your symptoms for the next few days.  Drink plenty of fluids that contain water salt and sugar. Sports drinks such as Gatorade may help.   You may try broths, soups, bananas, applesauce, soft breads, mashed potatoes or crackers.   You are considered infectious for as long as the diarrhea continues. Hand washing or use of alcohol based hand sanitizers is recommend.  It is best to stay out of work or school until your symptoms stop.   GET HELP RIGHT AWAY  If you have dark yellow colored urine or do not pass urine frequently you should drink more fluids.    If your symptoms worsen   If you feel like you are going to pass out (faint)  You have a new problem  MAKE SURE YOU   Understand these instructions.  Will watch your condition.  Will  get help right away if you are not doing well or get worse.  Your e-visit answers were reviewed by a board certified advanced clinical practitioner to complete your personal care plan.  Depending on the condition, your plan could have included both over the counter or prescription medications.  If there is a problem please reply  once you have received a response from your provider.  Your safety is important to [place].  If you have drug allergies check your prescription carefully.    You can use MyChart to ask questions about today's visit, request a non-urgent call back, or ask for a work or school excuse.  You will get an e-mail in the next two days asking about your experience.  I hope that your e-visit has been valuable and will speed your recovery. Thank you for using e-visits.

## 2015-04-07 ENCOUNTER — Ambulatory Visit: Payer: BC Managed Care – PPO | Admitting: Internal Medicine

## 2015-04-14 ENCOUNTER — Ambulatory Visit (INDEPENDENT_AMBULATORY_CARE_PROVIDER_SITE_OTHER): Payer: BC Managed Care – PPO | Admitting: Internal Medicine

## 2015-04-14 ENCOUNTER — Other Ambulatory Visit (INDEPENDENT_AMBULATORY_CARE_PROVIDER_SITE_OTHER): Payer: BC Managed Care – PPO

## 2015-04-14 ENCOUNTER — Encounter: Payer: Self-pay | Admitting: Internal Medicine

## 2015-04-14 VITALS — BP 116/70 | HR 73 | Temp 98.5°F | Resp 12 | Wt 164.4 lb

## 2015-04-14 DIAGNOSIS — E063 Autoimmune thyroiditis: Secondary | ICD-10-CM

## 2015-04-14 LAB — T3, FREE: T3, Free: 3.3 pg/mL (ref 2.3–4.2)

## 2015-04-14 LAB — T4, FREE: Free T4: 0.71 ng/dL (ref 0.60–1.60)

## 2015-04-14 LAB — TSH: TSH: 0.79 u[IU]/mL (ref 0.35–4.50)

## 2015-04-14 NOTE — Progress Notes (Signed)
Patient ID: Jacqueline Calhoun, female   DOB: 01-18-1976, 39 y.o.   MRN: [id number]   HPI  Jacqueline Calhoun is a 39 y.o.-year-old female, returning for f/u for Hashimoto's thyroiditis. Last visit 6 mo ago.  [facility], [facility], elliptical + weights. Lost 11 lbs since last visit.  Reviewed hx: Pt. has been dx with Hashimoto's thyroiditis in 08/2014 during investigation for several complaints >> see below.  She described CP in 2012  >> anxiety, HTN >> started anxiety meds and BP meds, but sxs not better. She also had depression; last year also started to have night sweats, now better. She wakes up early in am, does not sleep well; also has dry skin. Menses: after her baby in 2007 >> menorrhagia >> saw ObGyn >> different images tests >> thick endometrium. In 2014 she had an ovarian cyst >> pain >> started OCPs.   Pt also describes: - + weight loss (11 lbs since last visit), previously gain >> 18 lbs in last 3.5 years - no fatigue, poor sleep - no heat intolerance - no diarrhea/+ occas.constipation - + dry skin + itching - + hair loss - + Constipation - + missed 1 w/al bleed  I reviewed pt's thyroid tests: Lab Results  Component Value Date   TSH 1.07 10/08/2014   TSH 1.353 10/30/2012   TSH 1.099 07/20/2011   FREET4 0.78 10/08/2014   FREET4 1.13 10/30/2012  04/23/2014 (ObGyn): TSH 0.93, fT4 0.96, fT3 3.2  Component Date Value Ref Range  . Thyroid Peroxidase Antibody 09/23/2014 6  <9 IU/mL  . Thyroglobulin Ab 09/23/2014 5* <2 IU/mL   Pt denies feeling nodules in neck, hoarseness, dysphagia/odynophagia, SOB with lying down. Has occasional choking.  She also has a h/o HTN and elevated LFTs >> normalized after starting a gluten-free diet.  ROS: Constitutional: + see HPI Eyes: no blurry vision, no xerophthalmia ENT: no sore throat, no nodules palpated in throat, no dysphagia/odynophagia, no hoarseness Cardiovascular: no CP/SOB/palpitations/+ hands and  feet swelling Respiratory: no cough/no SOB Gastrointestinal: no N/V/D/+ C/+ heartburn Musculoskeletal: + muscle aches/no joint aches Skin: no rashes, + itching Neurological: no tremors/numbness/tingling/dizziness + low libido  I reviewed pt's medications, allergies, PMH, social hx, family hx, and changes were documented in the history of present illness. Otherwise, unchanged from my initial visit note:  Past Medical History  Diagnosis Date  . IBS (irritable bowel syndrome)   . Anxiety   . Hypertension    No past surgical history on file.   History   Social History  . Marital Status: Married    Spouse Name: N/A    Number of Children: 1   Occupational History  . teacher   Social History Main Topics  . Smoking status: Never Smoker   . Smokeless tobacco: Never Used  . Alcohol Use: 1.2 oz/week    1-2 Shots of liquor per week  . Drug Use: No  . Sexual Activity: Yes    Birth Control/ Protection: Condom   Current Outpatient Prescriptions on File Prior to Visit  Medication Sig Dispense Refill  . B Complex-C (B-COMPLEX WITH VITAMIN C) tablet Take 1 tablet by mouth daily.    . Cholecalciferol (VITAMIN D) 2000 UNITS CAPS Take 1 capsule by mouth daily.    . LO LOESTRIN FE 1 MG-10 MCG / 10 MCG tablet   4  . losartan (COZAAR) 50 MG tablet Take 1 tablet (50 mg total) by mouth daily. 90 tablet 1  .  Multiple Vitamin (MULTIVITAMIN) tablet Take 1 tablet by mouth daily.      . Probiotic Product (PROBIOTIC ACIDOPHILUS PO) Take 1 tablet by mouth daily.      . Flaxseed, Linseed, (FLAX SEED OIL PO) Take 1 tablet by mouth daily.     No current facility-administered medications on file prior to visit.   No Known Allergies   Family History  Problem Relation Age of Onset  . Hypertension Mother   . Hypertension Father   . Anxiety disorder Father   . Heart disease Maternal Grandmother 57    MI  . Heart disease Maternal Grandfather 11    MI  . Thyroid disease Paternal Aunt   . Cancer  Paternal Aunt     breast  . Cancer Maternal Aunt     colon  . Alzheimer's disease Paternal Grandmother   . Thyroid disease Paternal Grandmother   . Cancer Paternal Grandfather 54    PE: BP 116/70 mmHg  Pulse 73  Temp(Src) 98.5 F (36.9 C) (Oral)  Resp 12  Wt 164 lb 6.4 oz (74.571 kg)  SpO2 98% Body mass index is 27.79 kg/(m^2). Wt Readings from Last 3 Encounters:  04/14/15 164 lb 6.4 oz (74.571 kg)  10/08/14 175 lb (79.379 kg)  09/23/14 174 lb 12.8 oz (79.289 kg)   Constitutional: overweight, in NAD Eyes: PERRLA, EOMI, no exophthalmos ENT: moist mucous membranes, no thyromegaly, no cervical lymphadenopathy Cardiovascular: RRR, No MRG Respiratory: CTA B Gastrointestinal: abdomen soft, NT, ND, BS+ Musculoskeletal: no deformities, strength intact in all 4 Skin: moist, warm, no rashes Neurological: no tremor with outstretched hands, DTR normal in all 4  ASSESSMENT: 1. Hashimoto thyroiditis - normal TPO Abs - slightly high ATA  PLAN: 1. Hashimoto thyroiditis - she started to improve her lifestyle after last visit to give her immune system a boost >> she feels better and lost weight since last visit - We reviewed her recent thyroid functional tests and antibody levels: TPO Abs were normal, while the ATA were minimally elevated. - The treatment for Hashimoto thyroiditis is limited to thyroid hormones in case her TFTs are abnormal. We will manage her expectantly for now.  - We decided to check thyroid tests now and in 6 mo (per her request, will repeat the AB's): Orders Placed This Encounter  Procedures  . TSH  . T4, free  . T3, free  . Thyroglobulin antibody  . Thyroid peroxidase antibody  - I will see her back in a year  Appointment on 04/14/2015  Component Date Value Ref Range Status  . TSH 04/14/2015 0.79  0.35 - 4.50 uIU/mL Final  . Free T4 04/14/2015 0.71  0.60 - 1.60 ng/dL Final  . T3, Free 04/14/2015 3.3  2.3 - 4.2 pg/mL Final   TFTs are great! No need to add  LT4. AB's pending.

## 2015-04-14 NOTE — Patient Instructions (Signed)
Please stop at [facility] for labs.  Please come back for labs in 6 months and for an appt in 1 year.

## 2015-04-15 LAB — THYROGLOBULIN ANTIBODY: Thyroglobulin Ab: 3 IU/mL — ABNORMAL HIGH (ref <2)

## 2015-04-15 LAB — THYROID PEROXIDASE ANTIBODY: THYROID PEROXIDASE ANTIBODY: 6 [IU]/mL (ref <9)

## 2015-04-19 ENCOUNTER — Other Ambulatory Visit: Payer: Self-pay | Admitting: Family Medicine

## 2015-05-21 IMAGING — CT CT ABD-PELV W/ CM
2 of 4 series · 15 of 46 positions shown, 17 images · IV contrast (APPLIED)
Comparison: None.

CLINICAL DATA: Right lower quadrant pain

CT ABDOMEN AND PELVIS WITH CONTRAST
TECHNIQUE: Multidetector CT imaging of the abdomen and pelvis was
performed following the standard protocol during bolus
administration of intravenous contrast.
Contrast: 50mL OMNIPAQUE IOHEXOL 300 MG/ML  SOLN, 100mL OMNIPAQUE
IOHEXOL 300 MG/ML  SOLN

[Series 2: abd/pelvis 5.0 b31f · axial · 0.77mm/px · z∈[+846,+1296]mm · 12 of 100 slices shown, 14 images]
[im 5/100  soft-tissue]
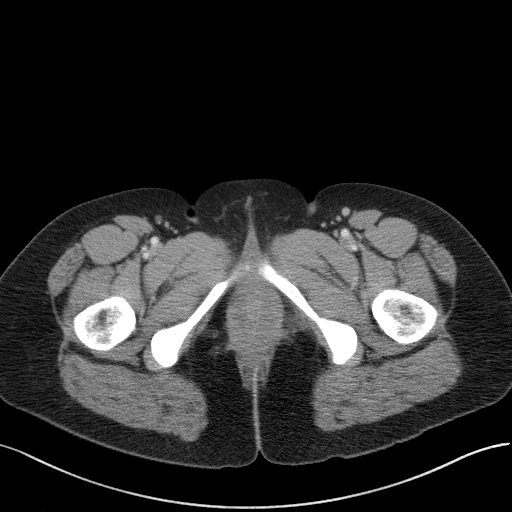
[im 5/100  bone]
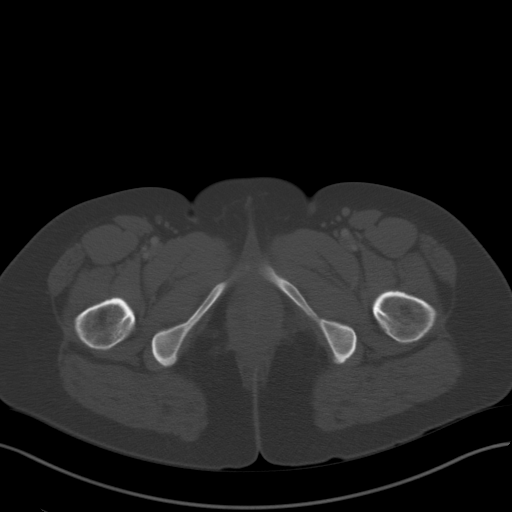
[im 13/100  soft-tissue]
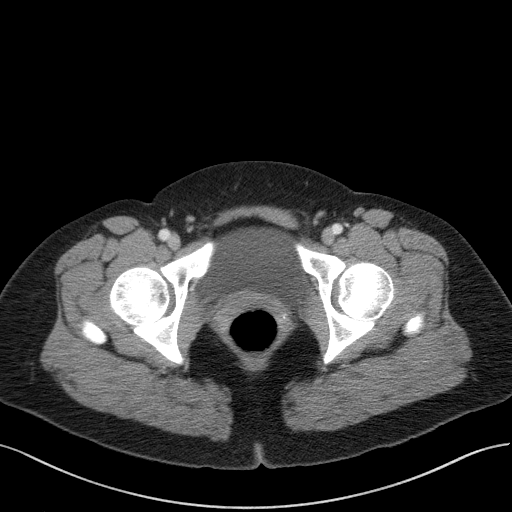
[im 22/100  soft-tissue]
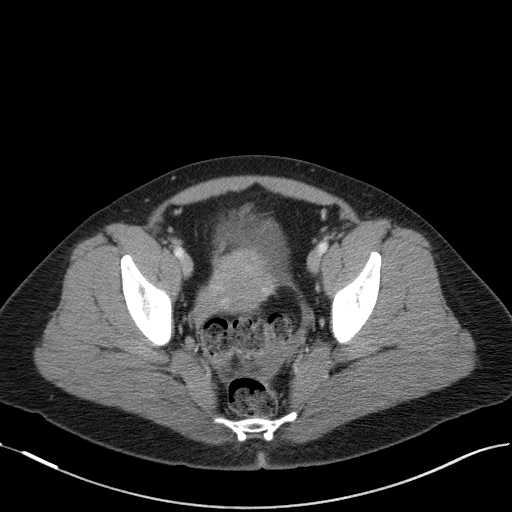
[im 31/100  soft-tissue]
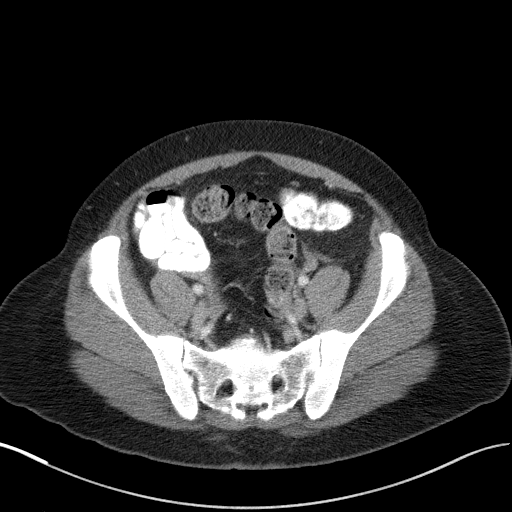
[im 39/100  soft-tissue]
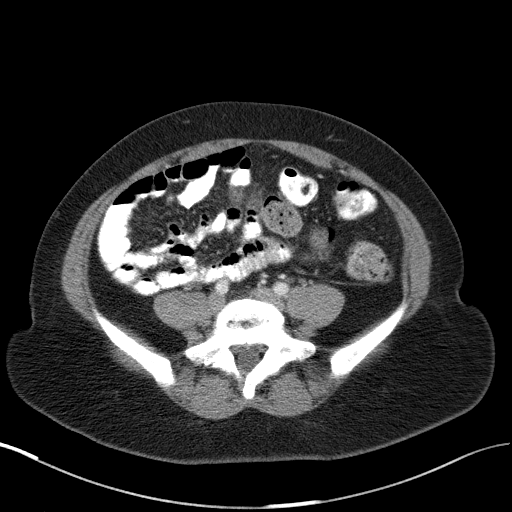
[im 48/100  soft-tissue]
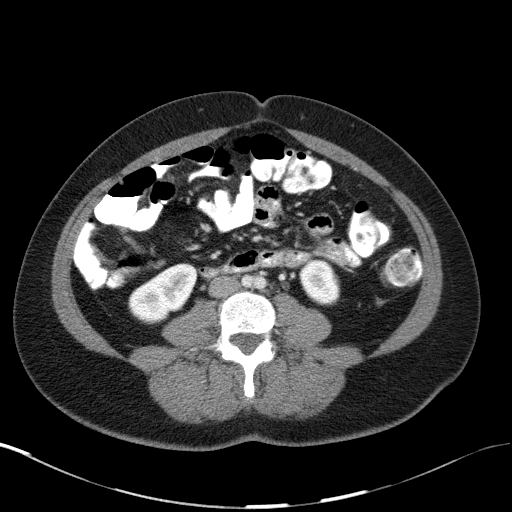
[im 52/100  soft-tissue]
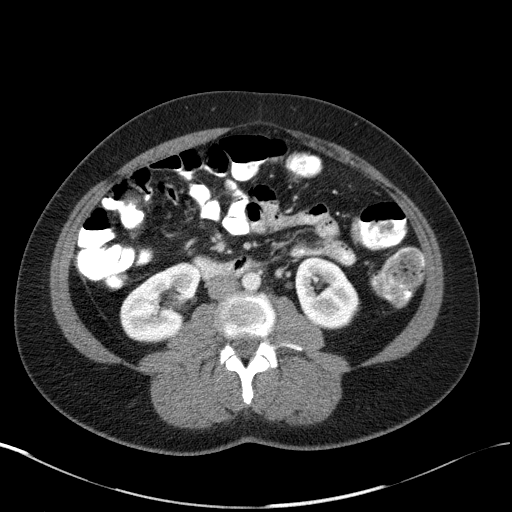
[im 61/100  soft-tissue]
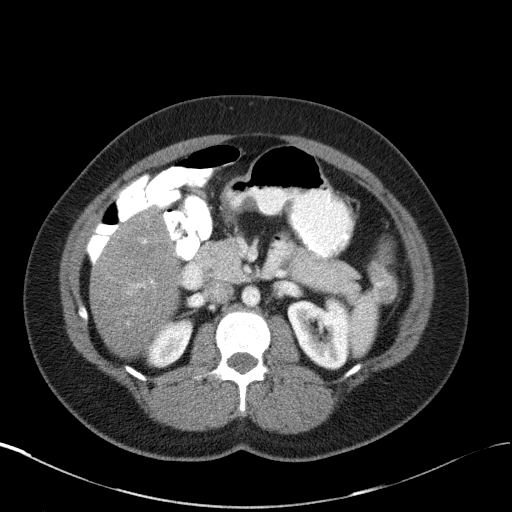
[im 69/100  soft-tissue]
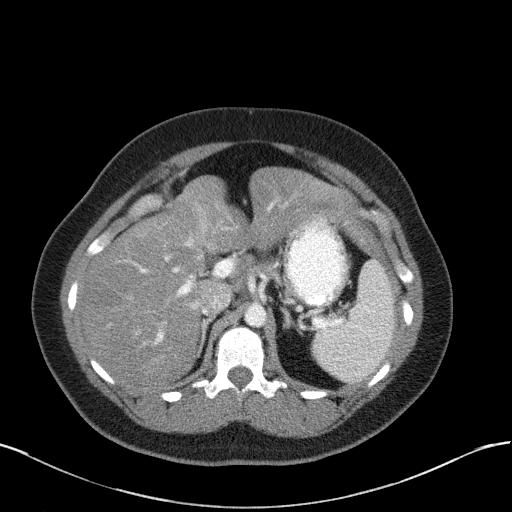
[im 69/100  bone]
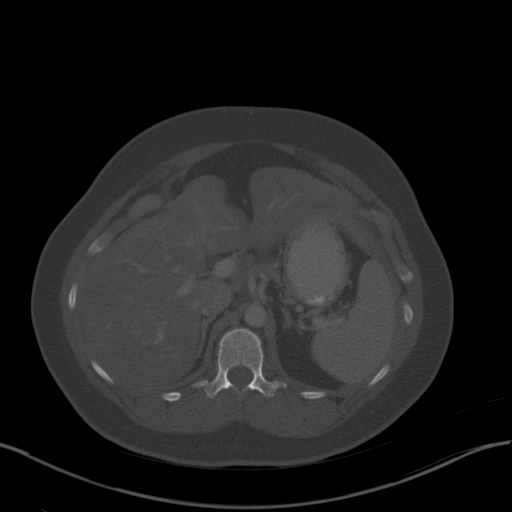
[im 78/100  soft-tissue]
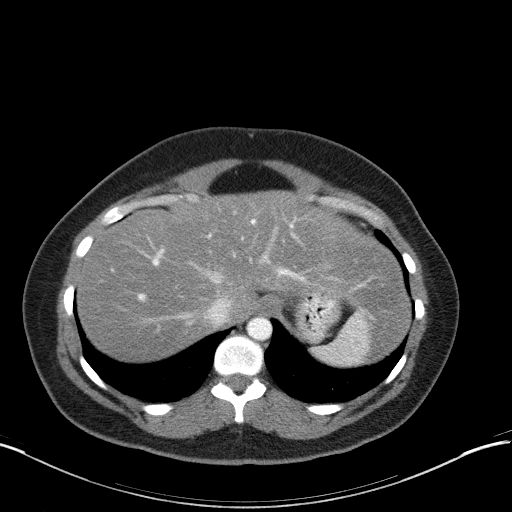
[im 87/100  soft-tissue]
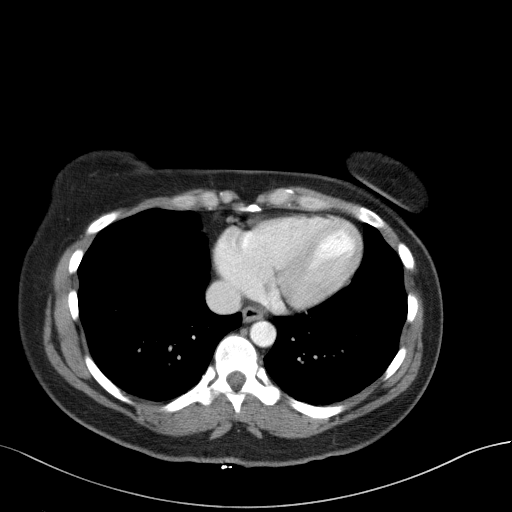
[im 95/100  soft-tissue]
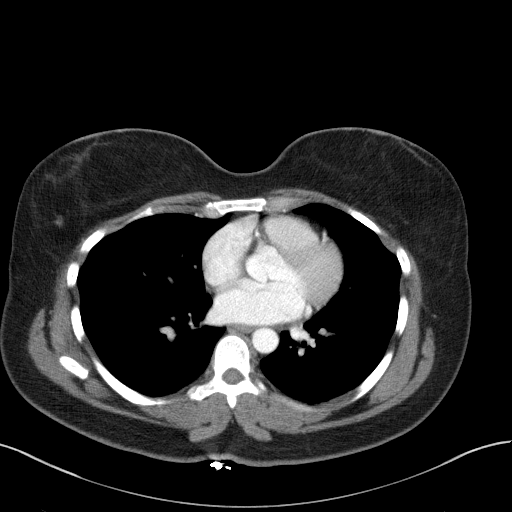

[Series 5: abd/pelvis 3.0 coronal · coronal · 0.87mm/px · 3 of 78 slices shown]
[im 26/78  soft-tissue]
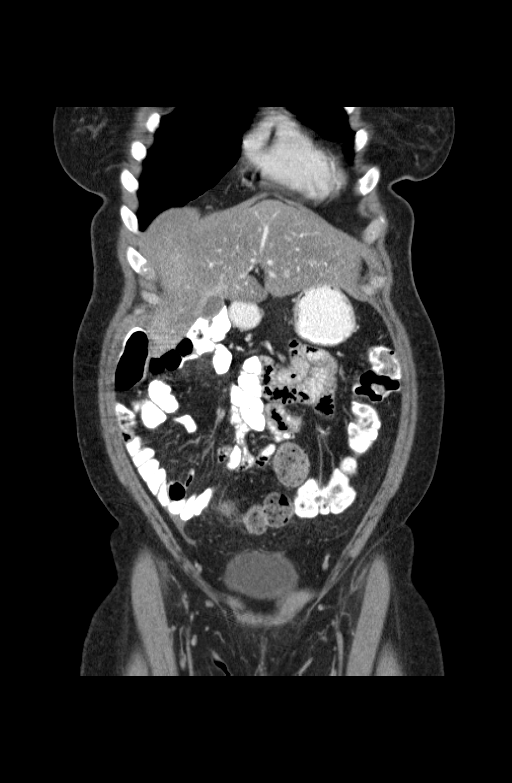
[im 35/78  soft-tissue]
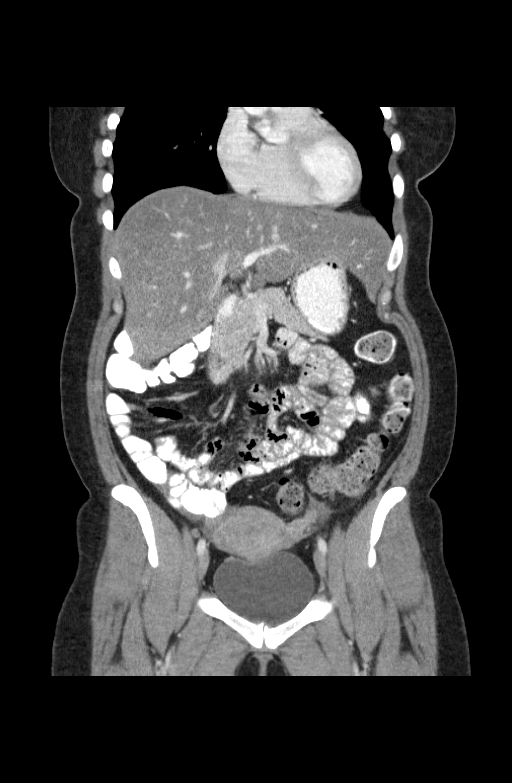
[im 43/78  soft-tissue]
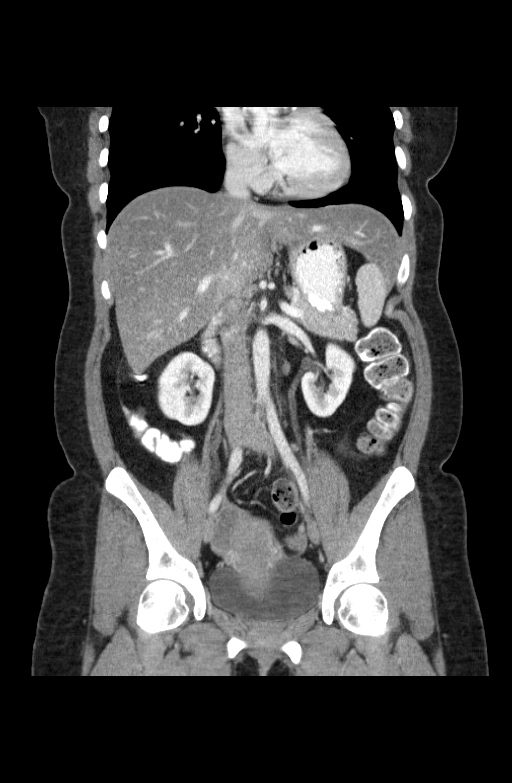

[15 of 46 positions shown; findings below may reference images not displayed]

FINDINGS: The cecum is floppy and positioned in the right upper
quadrant.  The appendix is within normal limits and indicated with
arrows.

Prominent stool burden in the descending and sigmoid colon.

There is a small amount of free fluid layering in the pelvis.  Left
adnexa is unremarkable.  There is a complex 4.6 x 2.5 cm solid and
cystic abnormality within the right adnexa.  The bladder and
prostate are within normal limits.

The thoracolumbar and lumbosacral junctions are transitional.
Degenerative disc disease in the lower lumbar spine is present.

4 mm nodule in the lingula on image two.

Severe diffuse hepatic steatosis.

The gallbladder, spleen, pancreas, adrenal glands, kidneys are
within normal limits.

No free intraperitoneal gas.

No obvious retroperitoneal adenopathy.
IMPRESSION: Normal appendix.

Complex solid and cystic abnormality in the right adnexa.  This may
simply represent a large cystic abnormality of the ovary.  Other
conditions such as hydrosalpinx should be considered.  Pelvic
ultrasound is recommended to further characterize.

Prominent stool burden in the distal colon.

Small amount of free fluid in the pelvis

4 mm left upper lobe nodule. If the patient is at high risk for
bronchogenic carcinoma, follow-up chest CT at 1 year is
recommended.  If the patient is at low risk, no follow-up is
needed.  This recommendation follows the consensus statement:
Guidelines for Management of Small Pulmonary Nodules Detected on CT
Scans:  A Statement from the [HOSPITAL] as published in

Degenerative disc disease in the lower lumbar spine.

## 2015-10-20 LAB — HM PAP SMEAR

## 2015-12-01 ENCOUNTER — Other Ambulatory Visit (INDEPENDENT_AMBULATORY_CARE_PROVIDER_SITE_OTHER): Payer: BC Managed Care – PPO

## 2015-12-01 DIAGNOSIS — E063 Autoimmune thyroiditis: Secondary | ICD-10-CM

## 2015-12-01 LAB — T4, FREE: FREE T4: 0.69 ng/dL (ref 0.60–1.60)

## 2015-12-01 LAB — T3, FREE: T3, Free: 3.6 pg/mL (ref 2.3–4.2)

## 2015-12-01 LAB — TSH: TSH: 1.03 u[IU]/mL (ref 0.35–4.50)

## 2015-12-20 ENCOUNTER — Other Ambulatory Visit: Payer: Self-pay | Admitting: Family Medicine

## 2015-12-22 NOTE — Telephone Encounter (Signed)
Please schedule this patient an OV with Dr.Lowne, she has not been seen in over a year.      KP

## 2015-12-25 NOTE — Telephone Encounter (Signed)
lvm advising patient of message below °

## 2016-01-27 ENCOUNTER — Ambulatory Visit (INDEPENDENT_AMBULATORY_CARE_PROVIDER_SITE_OTHER): Payer: BC Managed Care – PPO | Admitting: Family Medicine

## 2016-01-27 ENCOUNTER — Encounter: Payer: Self-pay | Admitting: Family Medicine

## 2016-01-27 VITALS — BP 122/88 | HR 75 | Temp 98.3°F | Ht 65.0 in | Wt 168.4 lb

## 2016-01-27 DIAGNOSIS — I10 Essential (primary) hypertension: Secondary | ICD-10-CM

## 2016-01-27 MED ORDER — LOSARTAN POTASSIUM 50 MG PO TABS: 50.0000 mg | ORAL_TABLET | Freq: Every day | ORAL | Status: DC

## 2016-01-27 NOTE — Progress Notes (Signed)
Pre visit review using our clinic review tool, if applicable. No additional management support is needed unless otherwise documented below in the visit note. 

## 2016-01-27 NOTE — Progress Notes (Signed)
Patient ID: Jacqueline Calhoun, female    DOB: 1976-02-21  Age: 40 y.o. MRN: [id number]    Subjective:  Subjective HPI [name] presents for bp f/u.  No complaints.  She is eating gluten free and feels great.    Review of Systems  Constitutional: Negative for diaphoresis, appetite change, fatigue and unexpected weight change.  Eyes: Negative for pain, redness and visual disturbance.  Respiratory: Negative for cough, chest tightness, shortness of breath and wheezing.   Cardiovascular: Negative for chest pain, palpitations and leg swelling.  Endocrine: Negative for cold intolerance, heat intolerance, polydipsia, polyphagia and polyuria.  Genitourinary: Negative for dysuria, frequency and difficulty urinating.  Neurological: Negative for dizziness, light-headedness, numbness and headaches.    History Past Medical History  Diagnosis Date  . IBS (irritable bowel syndrome)   . Anxiety   . Hypertension     She has no past surgical history on file.   Her family history includes Alzheimer's disease in her paternal grandmother; Anxiety disorder in her father; Cancer in her maternal aunt and paternal aunt; Cancer (age of onset: 30) in her paternal grandfather; Heart disease (age of onset: 56) in her maternal grandfather and maternal grandmother; Hypertension in her father and mother; Thyroid disease in her paternal aunt and paternal grandmother.She reports that she has never smoked. She has never used smokeless tobacco. She reports that she drinks about 1.2 oz of alcohol per week. She reports that she does not use illicit drugs.  Current Outpatient Prescriptions on File Prior to Visit  Medication Sig Dispense Refill  . B Complex-C (B-COMPLEX WITH VITAMIN C) tablet Take 1 tablet by mouth daily.    . Cholecalciferol (VITAMIN D) 2000 UNITS CAPS Take 1 capsule by mouth daily.    . LO LOESTRIN FE 1 MG-10 MCG / 10 MCG tablet   4  . Multiple Vitamin (MULTIVITAMIN) tablet Take 1 tablet by mouth daily.       Marland Kitchen OVER THE COUNTER MEDICATION Take 2-3 capsules by mouth 2 (two) times daily with a meal. Digestive enzymes    . Probiotic Product (PROBIOTIC ACIDOPHILUS PO) Take 1 tablet by mouth daily.       No current facility-administered medications on file prior to visit.     Objective:  Objective Physical Exam  Constitutional: She is oriented to person, place, and time. She appears well-developed and well-nourished.  HENT:  Head: Normocephalic and atraumatic.  Eyes: Conjunctivae and EOM are normal.  Neck: Normal range of motion. Neck supple. No JVD present. Carotid bruit is not present. No thyromegaly present.  Cardiovascular: Normal rate, regular rhythm and normal heart sounds.   No murmur heard. Pulmonary/Chest: Effort normal and breath sounds normal. No respiratory distress. She has no wheezes. She has no rales. She exhibits no tenderness.  Musculoskeletal: She exhibits no edema.  Neurological: She is alert and oriented to person, place, and time.  Psychiatric: She has a normal mood and affect. Judgment and thought content normal.  Nursing note and vitals reviewed.  BP 122/88 mmHg  Pulse 75  Temp(Src) 98.3 F (36.8 C) (Oral)  Ht 5\' 5"  (1.651 m)  Wt 168 lb 6.4 oz (76.386 kg)  BMI 28.02 kg/m2  SpO2 99%  LMP 12/28/2015 Wt Readings from Last 3 Encounters:  01/27/16 168 lb 6.4 oz (76.386 kg)  04/14/15 164 lb 6.4 oz (74.571 kg)  10/08/14 175 lb (79.379 kg)     Lab Results  Component Value Date   WBC 9.2 05/15/2013   HGB 14.2 05/15/2013  HCT 42.0 05/15/2013   PLT 345 05/15/2013   GLUCOSE 79 08/07/2013   CHOL 192 08/14/2012   TRIG 111 08/14/2012   HDL 57 08/14/2012   LDLCALC 113* 08/14/2012   ALT 36* 09/23/2014   AST 24 09/23/2014   NA 138 08/07/2013   K 4.3 08/07/2013   CL 103 08/07/2013   CREATININE 0.73 08/07/2013   BUN 16 08/07/2013   CO2 27 08/07/2013   TSH 1.03 12/01/2015    Dg Chest 2 View  06/04/2014  CLINICAL DATA:  Cough x3 weeks.  Hypertension. EXAM:  CHEST  2 VIEW COMPARISON:  CT 05/15/2013. FINDINGS: Mediastinum hilar structures normal. Lungs are clear. Heart size normal. No acute bony abnormality. IMPRESSION: No active cardiopulmonary disease. Electronically Signed   By: Marcello Moores  Register   On: 06/04/2014 13:01     Assessment & Plan:  Plan I have discontinued Ms. Catalano's (Flaxseed, Linseed, (FLAX SEED OIL PO)) and Selenium. I have also changed her losartan. Additionally, I am having her maintain her Probiotic Product (PROBIOTIC ACIDOPHILUS PO), multivitamin, B-complex with vitamin C, Vitamin D, LO LOESTRIN FE, and OVER THE COUNTER MEDICATION.  Meds ordered this encounter  Medications  . losartan (COZAAR) 50 MG tablet    Sig: Take 1 tablet (50 mg total) by mouth daily.    Dispense:  90 tablet    Refill:  1    Problem List Items Addressed This Visit      Unprioritized   Hypertension - Primary    Cont losartan and take everyday Pt see gyn and endo regularly F/u 1 year or sooner prn She will have labs from gyn sent to [place]      Relevant Medications   losartan (COZAAR) 50 MG tablet      Follow-up: Return in about 1 year (around 01/26/2017), or if symptoms worsen or fail to improve, for hypertension.  Ann Held, DO

## 2016-01-27 NOTE — Patient Instructions (Signed)
Hypertension Hypertension, commonly called high blood pressure, is when the force of blood pumping through your arteries is too strong. Your arteries are the blood vessels that carry blood from your heart throughout your body. A blood pressure reading consists of a higher number over a lower number, such as 110/72. The higher number (systolic) is the pressure inside your arteries when your heart pumps. The lower number (diastolic) is the pressure inside your arteries when your heart relaxes. Ideally you want your blood pressure below 120/80. Hypertension forces your heart to work harder to pump blood. Your arteries may become narrow or stiff. Having untreated or uncontrolled hypertension can cause heart attack, stroke, kidney disease, and other problems. RISK FACTORS Some risk factors for high blood pressure are controllable. Others are not.  Risk factors you cannot control include:   Race. You may be at higher risk if you are African American.  Age. Risk increases with age.  Gender. Men are at higher risk than women before age 45 years. After age 65, women are at higher risk than men. Risk factors you can control include:  Not getting enough exercise or physical activity.  Being overweight.  Getting too much fat, sugar, calories, or salt in your diet.  Drinking too much alcohol. SIGNS AND SYMPTOMS Hypertension does not usually cause signs or symptoms. Extremely high blood pressure (hypertensive crisis) may cause headache, anxiety, shortness of breath, and nosebleed. DIAGNOSIS To check if you have hypertension, your health care provider will measure your blood pressure while you are seated, with your arm held at the level of your heart. It should be measured at least twice using the same arm. Certain conditions can cause a difference in blood pressure between your right and left arms. A blood pressure reading that is higher than normal on one occasion does not mean that you need treatment. If  it is not clear whether you have high blood pressure, you may be asked to return on a different day to have your blood pressure checked again. Or, you may be asked to monitor your blood pressure at home for 1 or more weeks. TREATMENT Treating high blood pressure includes making lifestyle changes and possibly taking medicine. Living a healthy lifestyle can help lower high blood pressure. You may need to change some of your habits. Lifestyle changes may include:  Following the DASH diet. This diet is high in fruits, vegetables, and whole grains. It is low in salt, red meat, and added sugars.  Keep your sodium intake below 2,300 mg per day.  Getting at least 30-45 minutes of aerobic exercise at least 4 times per week.  Losing weight if necessary.  Not smoking.  Limiting alcoholic beverages.  Learning ways to reduce stress. Your health care provider may prescribe medicine if lifestyle changes are not enough to get your blood pressure under control, and if one of the following is true:  You are 18-59 years of age and your systolic blood pressure is above 140.  You are 60 years of age or older, and your systolic blood pressure is above 150.  Your diastolic blood pressure is above 90.  You have diabetes, and your systolic blood pressure is over 140 or your diastolic blood pressure is over 90.  You have kidney disease and your blood pressure is above 140/90.  You have heart disease and your blood pressure is above 140/90. Your personal target blood pressure may vary depending on your medical conditions, your age, and other factors. HOME CARE INSTRUCTIONS    Have your blood pressure rechecked as directed by your health care provider.   Take medicines only as directed by your health care provider. Follow the directions carefully. Blood pressure medicines must be taken as prescribed. The medicine does not work as well when you skip doses. Skipping doses also puts you at risk for  problems.  Do not smoke.   Monitor your blood pressure at home as directed by your health care provider. SEEK MEDICAL CARE IF:   You think you are having a reaction to medicines taken.  You have recurrent headaches or feel dizzy.  You have swelling in your ankles.  You have trouble with your vision. SEEK IMMEDIATE MEDICAL CARE IF:  You develop a severe headache or confusion.  You have unusual weakness, numbness, or feel faint.  You have severe chest or abdominal pain.  You vomit repeatedly.  You have trouble breathing. MAKE SURE YOU:   Understand these instructions.  Will watch your condition.  Will get help right away if you are not doing well or get worse.   This information is not intended to replace advice given to you by your health care provider. Make sure you discuss any questions you have with your health care provider.   Document Released: 08/16/2005 Document Revised: 12/31/2014 Document Reviewed: 06/08/2013 Elsevier Interactive Patient Education 2016 [employer] Inc[employer]  

## 2016-01-27 NOTE — Assessment & Plan Note (Signed)
Cont losartan and take everyday Pt see gyn and endo regularly F/u 1 year or sooner prn She will have labs from gyn sent to [place]

## 2016-01-28 ENCOUNTER — Telehealth: Payer: Self-pay | Admitting: Family Medicine

## 2016-01-28 NOTE — Telephone Encounter (Signed)
Faxed Medical Records Request to Dr. Princess Bruins, Fax # [phone], conf that fax went thru ok 01/28/16 8:34am

## 2016-02-03 ENCOUNTER — Telehealth: Payer: Self-pay | Admitting: Family Medicine

## 2016-02-03 ENCOUNTER — Encounter: Payer: Self-pay | Admitting: Family Medicine

## 2016-02-03 ENCOUNTER — Ambulatory Visit (INDEPENDENT_AMBULATORY_CARE_PROVIDER_SITE_OTHER): Payer: BC Managed Care – PPO | Admitting: Family Medicine

## 2016-02-03 VITALS — BP 154/90 | HR 69 | Temp 98.2°F | Ht 65.0 in | Wt 167.8 lb

## 2016-02-03 DIAGNOSIS — W57XXXA Bitten or stung by nonvenomous insect and other nonvenomous arthropods, initial encounter: Secondary | ICD-10-CM | POA: Diagnosis not present

## 2016-02-03 DIAGNOSIS — L259 Unspecified contact dermatitis, unspecified cause: Secondary | ICD-10-CM | POA: Diagnosis not present

## 2016-02-03 DIAGNOSIS — T148 Other injury of unspecified body region: Secondary | ICD-10-CM

## 2016-02-03 MED ORDER — DOXYCYCLINE HYCLATE 100 MG PO TABS: 100.0000 mg | ORAL_TABLET | Freq: Two times a day (BID) | ORAL | Status: DC

## 2016-02-03 MED ORDER — PREDNISONE 10 MG PO TABS: ORAL_TABLET | ORAL | Status: DC

## 2016-02-03 MED FILL — DOXYCYCLINE 100 MG TABLET: 100 | 10 days supply | Qty: 20 | Fill #0

## 2016-02-03 MED FILL — predniSONE 10 MG TABS: 10 | 9 days supply | Qty: 18 | Fill #0

## 2016-02-03 NOTE — Patient Instructions (Addendum)

## 2016-02-03 NOTE — Telephone Encounter (Signed)
Patient Name: Jacqueline Calhoun DOB: Jun 16, 1976 Initial Comment Caller states c/o widespread rash Nurse Assessment Nurse: Ronnald Ramp, RN, Miranda Date/Time (Eastern Time): 02/03/2016 11:32:37 AM Confirm and document reason for call. If symptomatic, describe symptoms. You must click the next button to save text entered. ---Caller states she has had a rash since Saturday. She was seen in UC on Sunday and diagnosed with hand foot and mouth disease. The rash started on her upper legs. She has rash on her back, arms, and tops of her feet. Denies any blisters and no sores in her mouth. Has the patient traveled out of the country within the last 30 days? ---No Does the patient have any new or worsening symptoms? ---Yes Will a triage be completed? ---Yes Related visit to physician within the last 2 weeks? ---Yes Does the PT have any chronic conditions? (i.e. diabetes, asthma, etc.) ---Yes List chronic conditions. ---HTN Is the patient pregnant or possibly pregnant? (Ask all females between the ages of 47-55) ---No Is this a behavioral health or substance abuse call? ---No Guidelines Guideline Title Affirmed Question Affirmed Notes Rash or Redness - Widespread SEVERE itching (i.e., interferes with sleep, normal activities or school) Final Disposition User See Physician within 24 Hours Ronnald Ramp, RN, Miranda Comments Appt scheduled for today with Dr. Etter Sjogren at 4:00pm Referrals REFERRED TO PCP OFFICE Disagree/Comply: Comply

## 2016-02-03 NOTE — Telephone Encounter (Signed)
Noted. Appointment with Dr. Carollee Herter at 4:00 PM today.

## 2016-02-03 NOTE — Progress Notes (Signed)
Patient ID: Jacqueline Calhoun, female    DOB: Mar 19, 1976  Age: 40 y.o. MRN: [id number]    Subjective:  Subjective  Pt is here for a rash x 1 week --  Pt thought she had a insect bite on her buttock.  Rash started after that and quickly spread.  She went to [facility] Sunday and was given a steroid shot --- she was told she had hand, foot and mouth.  Lesions on her hand are gone.  Lesions are not really itchy.    HPI  Review of Systems  Constitutional: Negative for diaphoresis, appetite change, fatigue and unexpected weight change.  Eyes: Negative for pain, redness and visual disturbance.  Respiratory: Negative for cough, chest tightness, shortness of breath and wheezing.   Cardiovascular: Negative for chest pain, palpitations and leg swelling.  Endocrine: Negative for cold intolerance, heat intolerance, polydipsia, polyphagia and polyuria.  Genitourinary: Negative for dysuria, frequency and difficulty urinating.  Neurological: Negative for dizziness, light-headedness, numbness and headaches.    History Past Medical History  Diagnosis Date  . IBS (irritable bowel syndrome)   . Anxiety   . Hypertension     She has no past surgical history on file.   Her family history includes Alzheimer's disease in her paternal grandmother; Anxiety disorder in her father; Cancer in her maternal aunt and paternal aunt; Cancer (age of onset: 82) in her paternal grandfather; Heart disease (age of onset: 91) in her maternal grandfather and maternal grandmother; Hypertension in her father and mother; Thyroid disease in her paternal aunt and paternal grandmother.She reports that she has never smoked. She has never used smokeless tobacco. She reports that she drinks about 1.2 oz of alcohol per week. She reports that she does not use illicit drugs.  Current Outpatient Prescriptions on File Prior to Visit  Medication Sig Dispense Refill  . B Complex-C (B-COMPLEX WITH VITAMIN C) tablet Take 1 tablet by mouth daily.    .  Cholecalciferol (VITAMIN D) 2000 UNITS CAPS Take 1 capsule by mouth daily.    . LO LOESTRIN FE 1 MG-10 MCG / 10 MCG tablet   4  . losartan (COZAAR) 50 MG tablet Take 1 tablet (50 mg total) by mouth daily. 90 tablet 1  . Multiple Vitamin (MULTIVITAMIN) tablet Take 1 tablet by mouth daily.      Marland Kitchen OVER THE COUNTER MEDICATION Take 2-3 capsules by mouth 2 (two) times daily with a meal. Digestive enzymes    . Probiotic Product (PROBIOTIC ACIDOPHILUS PO) Take 1 tablet by mouth daily.       No current facility-administered medications on file prior to visit.     Objective:  Objective Physical Exam  Constitutional: She is oriented to person, place, and time. She appears well-developed and well-nourished.  HENT:  Head: Normocephalic and atraumatic.  Eyes: Conjunctivae and EOM are normal.  Neck: Normal range of motion. Neck supple. No JVD present. Carotid bruit is not present. No thyromegaly present.  Cardiovascular: Normal rate, regular rhythm and normal heart sounds.   No murmur heard. Pulmonary/Chest: Effort normal and breath sounds normal. No respiratory distress. She has no wheezes. She has no rales. She exhibits no tenderness.  Musculoskeletal: She exhibits no edema.  Neurological: She is alert and oriented to person, place, and time.  Skin: Rash noted. Rash is macular and urticarial. Rash is not maculopapular. There is erythema.     Psychiatric: She has a normal mood and affect.  Nursing note and vitals reviewed.  BP 154/90 mmHg  Pulse  69  Temp(Src) 98.2 F (36.8 C) (Oral)  Ht 5\' 5"  (1.651 m)  Wt 167 lb 12.8 oz (76.114 kg)  BMI 27.92 kg/m2  SpO2 97%  LMP 12/28/2015 Wt Readings from Last 3 Encounters:  02/03/16 167 lb 12.8 oz (76.114 kg)  01/27/16 168 lb 6.4 oz (76.386 kg)  04/14/15 164 lb 6.4 oz (74.571 kg)     Lab Results  Component Value Date   WBC 9.2 05/15/2013   HGB 14.2 05/15/2013   HCT 42.0 05/15/2013   PLT 345 05/15/2013   GLUCOSE 79 08/07/2013   CHOL 192  08/14/2012   TRIG 111 08/14/2012   HDL 57 08/14/2012   LDLCALC 113* 08/14/2012   ALT 36* 09/23/2014   AST 24 09/23/2014   NA 138 08/07/2013   K 4.3 08/07/2013   CL 103 08/07/2013   CREATININE 0.73 08/07/2013   BUN 16 08/07/2013   CO2 27 08/07/2013   TSH 1.03 12/01/2015    Dg Chest 2 View  06/04/2014  CLINICAL DATA:  Cough x3 weeks.  Hypertension. EXAM: CHEST  2 VIEW COMPARISON:  CT 05/15/2013. FINDINGS: Mediastinum hilar structures normal. Lungs are clear. Heart size normal. No acute bony abnormality. IMPRESSION: No active cardiopulmonary disease. Electronically Signed   By: Marcello Moores  Register   On: 06/04/2014 13:01     Assessment & Plan:      1. rash ? etiology - predniSONE (DELTASONE) 10 MG tablet; 3 po qd for 3 days then 2 po qd for 3 days the 1 po qd for 3 days  Dispense: 18 tablet; Refill: 0 - doxycycline (VIBRA-TABS) 100 MG tablet; Take 1 tablet (100 mg total) by mouth 2 (two) times daily.  Dispense: 20 tablet; Refill: 0 - Comprehensive metabolic panel; Future - CBC with Differential/Platelet; Future  2. Insect bite  - B. burgdorfi antibodies; Future - [place] spotted fvr ab, IgM-blood; Future

## 2016-02-03 NOTE — Progress Notes (Signed)
Pre visit review using our clinic review tool, if applicable. No additional management support is needed unless otherwise documented below in the visit note. 

## 2016-02-04 ENCOUNTER — Other Ambulatory Visit (INDEPENDENT_AMBULATORY_CARE_PROVIDER_SITE_OTHER): Payer: BC Managed Care – PPO

## 2016-02-04 DIAGNOSIS — L259 Unspecified contact dermatitis, unspecified cause: Secondary | ICD-10-CM | POA: Diagnosis not present

## 2016-02-04 DIAGNOSIS — W57XXXA Bitten or stung by nonvenomous insect and other nonvenomous arthropods, initial encounter: Secondary | ICD-10-CM

## 2016-02-04 LAB — COMPREHENSIVE METABOLIC PANEL
ALT: 18 U/L (ref 0–35)
AST: 15 U/L (ref 0–37)
Albumin: 4 g/dL (ref 3.5–5.2)
Alkaline Phosphatase: 46 U/L (ref 39–117)
BUN: 13 mg/dL (ref 6–23)
CALCIUM: 8.7 mg/dL (ref 8.4–10.5)
CHLORIDE: 105 meq/L (ref 96–112)
CO2: 27 meq/L (ref 19–32)
Creatinine, Ser: 0.92 mg/dL (ref 0.40–1.20)
GFR: 71.19 mL/min (ref >60.00)
GLUCOSE: 84 mg/dL (ref 70–99)
POTASSIUM: 3.8 meq/L (ref 3.5–5.1)
Sodium: 138 mEq/L (ref 135–145)
Total Bilirubin: 0.5 mg/dL (ref 0.2–1.2)
Total Protein: 6.7 g/dL (ref 6.0–8.3)

## 2016-02-04 LAB — CBC WITH DIFFERENTIAL/PLATELET
BASOS ABS: 0.1 10*3/uL (ref 0.0–0.1)
Basophils Relative: 0.8 % (ref 0.0–3.0)
EOS ABS: 0.3 10*3/uL (ref 0.0–0.7)
Eosinophils Relative: 3.5 % (ref 0.0–5.0)
HCT: 42.3 % (ref 36.0–46.0)
Hemoglobin: 14.1 g/dL (ref 12.0–15.0)
LYMPHS ABS: 2.7 10*3/uL (ref 0.7–4.0)
Lymphocytes Relative: 28.2 % (ref 12.0–46.0)
MCHC: 33.3 g/dL (ref 30.0–36.0)
MCV: 86.8 fl (ref 78.0–100.0)
MONO ABS: 0.8 10*3/uL (ref 0.1–1.0)
MONOS PCT: 7.9 % (ref 3.0–12.0)
NEUTROS PCT: 59.6 % (ref 43.0–77.0)
Neutro Abs: 5.8 10*3/uL (ref 1.4–7.7)
Platelets: 310 10*3/uL (ref 150.0–400.0)
RBC: 4.88 Mil/uL (ref 3.87–5.11)
RDW: 12.8 % (ref 11.5–15.5)
WBC: 9.7 10*3/uL (ref 4.0–10.5)

## 2016-02-05 LAB — LYME AB/WESTERN BLOT REFLEX

## 2016-02-06 ENCOUNTER — Encounter: Payer: Self-pay | Admitting: Family Medicine

## 2016-02-06 NOTE — Telephone Encounter (Signed)
See blood test

## 2016-02-07 LAB — ROCKY MTN SPOTTED FVR ABS PNL(IGG+IGM)
RMSF IGG: NOT DETECTED
RMSF IGM: NOT DETECTED

## 2016-02-09 ENCOUNTER — Encounter: Payer: Self-pay | Admitting: Family Medicine

## 2016-04-13 ENCOUNTER — Ambulatory Visit: Payer: BC Managed Care – PPO | Admitting: Internal Medicine

## 2016-06-03 ENCOUNTER — Ambulatory Visit (INDEPENDENT_AMBULATORY_CARE_PROVIDER_SITE_OTHER): Payer: BC Managed Care – PPO | Admitting: Internal Medicine

## 2016-06-03 ENCOUNTER — Encounter: Payer: Self-pay | Admitting: Internal Medicine

## 2016-06-03 VITALS — BP 132/90 | HR 76 | Wt 162.0 lb

## 2016-06-03 DIAGNOSIS — E063 Autoimmune thyroiditis: Secondary | ICD-10-CM

## 2016-06-03 LAB — T4, FREE: FREE T4: 0.69 ng/dL (ref 0.60–1.60)

## 2016-06-03 LAB — TSH: TSH: 0.76 u[IU]/mL (ref 0.35–4.50)

## 2016-06-03 LAB — T3, FREE: T3, Free: 3.5 pg/mL (ref 2.3–4.2)

## 2016-06-03 NOTE — Progress Notes (Signed)
Patient ID: Jacqueline Calhoun, female   DOB: [birth year], 40 y.o.   MRN: [id number]   HPI  Jacqueline Calhoun is a 40 y.o.-year-old female, returning for f/u for Hashimoto's thyroiditis. Last visit 1 year and 2 mo ago.  [facility] She is gluten-free for 2 years.  Reviewed hx: Pt. has been dx with Hashimoto's thyroiditis in 08/2014 during investigation for several complaints >> see below.  She described CP in 2012  >> anxiety, HTN >> started anxiety meds and BP meds, but sxs not better. She also had depression; last year also started to have night sweats, now better. She wakes up early in am, does not sleep well; also has dry skin. Menses: after her baby in 2007 >> menorrhagia >> saw ObGyn >> different images tests >> thick endometrium. In 2014 she had an ovarian cyst >> pain >> started OCPs.   Pt also describes: - + fatigue - no weight loss or gain - no fatigue, poor sleep - no heat intolerance - no diarrhea/constipation - + dry skin + itching - no hair loss - no Constipation  I reviewed pt's thyroid tests: Lab Results  Component Value Date   TSH 1.03 12/01/2015   TSH 0.79 04/14/2015   TSH 1.07 10/08/2014   TSH 1.353 10/30/2012   TSH 1.099 07/20/2011   FREET4 0.69 12/01/2015   FREET4 0.71 04/14/2015   FREET4 0.78 10/08/2014   FREET4 1.13 10/30/2012  04/23/2014 (ObGyn): TSH 0.93, fT4 0.96, fT3 3.2  Component     Latest Ref Rng & Units 09/23/2014 04/14/2015  Thyroperoxidase Ab SerPl-aCnc     <9 IU/mL 6 6  Thyroglobulin Ab     <2 IU/mL 5 (H) 3 (H)   Pt denies feeling nodules in neck, hoarseness, dysphagia/odynophagia, SOB with lying down. Has occasional choking.  She also has a h/o HTN and elevated LFTs >> normalized after starting a gluten-free diet.  ROS: Constitutional: + see HPI Eyes: no blurry vision, no xerophthalmia ENT: no sore throat, no nodules palpated in throat, no dysphagia/odynophagia, no hoarseness Cardiovascular: no CP/SOB/palpitations/hands and feet  swelling Respiratory: no cough/no SOB Gastrointestinal: no N/V/D/C/heartburn Musculoskeletal: no muscle aches/no joint aches Skin: no rashes, + itching, + dry skin Neurological: no tremors/numbness/tingling/dizziness + mood changes; + spotting last month  I reviewed pt's medications, allergies, PMH, social hx, family hx, and changes were documented in the history of present illness. Otherwise, unchanged from my initial visit note:  Past Medical History:  Diagnosis Date  . Anxiety   . Hypertension   . IBS (irritable bowel syndrome)    No past surgical history on file.   History   Social History  . Marital Status: Married    Spouse Name: N/A    Number of Children: 1   Occupational History  . teacher   Social History Main Topics  . Smoking status: Never Smoker   . Smokeless tobacco: Never Used  . Alcohol Use: 1.2 oz/week    1-2 Shots of liquor per week  . Drug Use: No  . Sexual Activity: Yes    Birth Control/ Protection: Condom   Current Outpatient Prescriptions on File Prior to Visit  Medication Sig Dispense Refill  . B Complex-C (B-COMPLEX WITH VITAMIN C) tablet Take 1 tablet by mouth daily.    . Cholecalciferol (VITAMIN D) 2000 UNITS CAPS Take 1 capsule by mouth daily.    . LO LOESTRIN FE 1 MG-10 MCG / 10 MCG tablet   4  . losartan (  COZAAR) 50 MG tablet Take 1 tablet (50 mg total) by mouth daily. 90 tablet 1  . Multiple Vitamin (MULTIVITAMIN) tablet Take 1 tablet by mouth daily.      Marland Kitchen OVER THE COUNTER MEDICATION Take 2-3 capsules by mouth 2 (two) times daily with a meal. Digestive enzymes    . Probiotic Product (PROBIOTIC ACIDOPHILUS PO) Take 1 tablet by mouth daily.       No current facility-administered medications on file prior to visit.    No Known Allergies   Family History  Problem Relation Age of Onset  . Hypertension Mother   . Hypertension Father   . Anxiety disorder Father   . Heart disease Maternal Grandmother 22    MI  . Heart disease Maternal  Grandfather 68    MI  . Thyroid disease Paternal Aunt   . Cancer Paternal Aunt     breast  . Cancer Maternal Aunt     colon  . Alzheimer's disease Paternal Grandmother   . Thyroid disease Paternal Grandmother   . Cancer Paternal Grandfather 39    PE: BP 132/90 (BP Location: Left Arm, Patient Position: Sitting)   Pulse 76   Wt 162 lb (73.5 kg)   LMP 05/31/2016   SpO2 98%   BMI 26.96 kg/m  Body mass index is 26.96 kg/m. Wt Readings from Last 3 Encounters:  06/03/16 162 lb (73.5 kg)  02/03/16 167 lb 12.8 oz (76.1 kg)  01/27/16 168 lb 6.4 oz (76.4 kg)   Constitutional: overweight, in NAD Eyes: PERRLA, EOMI, no exophthalmos ENT: moist mucous membranes, no thyromegaly, no cervical lymphadenopathy Cardiovascular: RRR, No MRG Respiratory: CTA B Gastrointestinal: abdomen soft, NT, ND, BS+ Musculoskeletal: no deformities, strength intact in all 4 Skin: moist, warm, no rashes Neurological: no tremor with outstretched hands, DTR normal in all 4  ASSESSMENT: 1. Hashimoto thyroiditis - normal TPO Abs - slightly high ATA  PLAN: 1. Hashimoto thyroiditis - she started to improve her lifestyle last year (diet, exercise) >> feels better. Did not lose more weight since last visit, but she feels well (only slightly higher fatigue) - We reviewed last year's thyroid panel and antibody levels: TPO Abs were normal, while the ATA were minimally elevated. - The treatment for Hashimoto thyroiditis is limited to thyroid hormones in case her TFTs are abnormal. We will manage her expectantly for now.  - Will check thyroid tests today + ATA per her request Orders Placed This Encounter  Procedures  . TSH  . T4, free  . T3, free  . Thyroglobulin antibody  - I will see her back in a year  Office Visit on 06/03/2016  Component Date Value Ref Range Status  . TSH 06/03/2016 0.76  0.35 - 4.50 uIU/mL Final  . Free T4 06/03/2016 0.69  0.60 - 1.60 ng/dL Final   Comment: Specimens from patients who  are undergoing biotin therapy and /or ingesting biotin supplements may contain high levels of biotin.  The higher biotin concentration in these specimens interferes with this Free T4 assay.  Specimens that contain high levels  of biotin may cause false high results for this Free T4 assay.  Please interpret results in light of the total clinical presentation of the patient.    . T3, Free 06/03/2016 3.5  2.3 - 4.2 pg/mL Final   ATA antibodies are pending.  Philemon Kingdom, MD PhD [facility] Endocrinology

## 2016-06-03 NOTE — Patient Instructions (Signed)
Please stop at the lab.  Please return in 1 year.  

## 2016-06-04 LAB — THYROGLOBULIN ANTIBODY: THYROGLOBULIN AB: 3 [IU]/mL — AB (ref <2)

## 2016-06-30 ENCOUNTER — Other Ambulatory Visit: Payer: Self-pay | Admitting: Urology

## 2016-07-01 ENCOUNTER — Encounter (HOSPITAL_BASED_OUTPATIENT_CLINIC_OR_DEPARTMENT_OTHER): Payer: Self-pay | Admitting: *Deleted

## 2016-07-01 ENCOUNTER — Encounter: Payer: Self-pay | Admitting: Medical

## 2016-07-01 ENCOUNTER — Ambulatory Visit (INDEPENDENT_AMBULATORY_CARE_PROVIDER_SITE_OTHER): Payer: BC Managed Care – PPO | Admitting: Medical

## 2016-07-01 VITALS — HR 84 | Temp 98.4°F | Ht 65.0 in | Wt 161.0 lb

## 2016-07-01 DIAGNOSIS — R0789 Other chest pain: Secondary | ICD-10-CM | POA: Diagnosis not present

## 2016-07-01 DIAGNOSIS — F411 Generalized anxiety disorder: Secondary | ICD-10-CM

## 2016-07-01 DIAGNOSIS — I10 Essential (primary) hypertension: Secondary | ICD-10-CM

## 2016-07-01 DIAGNOSIS — M79661 Pain in right lower leg: Secondary | ICD-10-CM

## 2016-07-01 LAB — TROPONIN I: TNIDX: 0 ug/l (ref 0.00–0.06)

## 2016-07-01 MED ORDER — LOSARTAN POTASSIUM 100 MG PO TABS: 100.0000 mg | ORAL_TABLET | Freq: Every day | ORAL | 0 refills | Status: DC

## 2016-07-01 MED ORDER — ALPRAZOLAM 0.5 MG PO TABS: 0.5000 mg | ORAL_TABLET | Freq: Three times a day (TID) | ORAL | 0 refills | Status: DC | PRN

## 2016-07-01 NOTE — Patient Instructions (Addendum)
For your recent anxiety will rx xanax .5 mg to use every 8 hours as needed. After your trip if mood depressed or anxiety will consider adding ssri type med and refilling your xanax.  For you increased bp I want you to increase cozaar to 100 mg a day.  Your anterior chest soreness I think is muscular pain but in light of recent events wanted to do ekg. Your ekg was normal except for  one area. This is likely normal variant but want to do labs. Will go over lab when in. If any worse chest pain then ED evaluation.  For your calf pain will get rt lower ext [place] today stat(please go down and schedule test to be done today)  While on the air plane tomorrow I want you to walk and stretch when you can.  Follow up in 7-10 days or as needed

## 2016-07-01 NOTE — Progress Notes (Signed)
Pre visit review using our clinic review tool, if applicable. No additional management support is needed unless otherwise documented below in the visit note. 

## 2016-07-01 NOTE — Progress Notes (Signed)
Subjective:    Patient ID: Jacqueline Calhoun, female    DOB: 27-Jun-1976, 40 y.o.   MRN: [id number]  HPI  Pt in traveling to [place]. Her mom paseed unexpected. Pt mom had a PE affter plane flight this week  Pt ha history of htn. She is on losartan 50 mg a day. Pt states with urologist office to assess her bladder tumor her bp that day was 140/90.  Pt sad and anxious about mom passing. In addition she has to travel on plane to [place]. She is concerned about getting a PE.  Pt has faint soreness in anterior chest. She does work out. She does not work out regular basis. Soreness since yesterday.  Pt lmp- beginning of October.     Review of Systems  Constitutional: Negative for chills, fatigue and fever.  Respiratory: Negative for cough, choking and shortness of breath.   Cardiovascular: Negative for chest pain and palpitations.  Gastrointestinal: Negative for abdominal pain, blood in stool, constipation and vomiting.  Genitourinary: Negative for dysuria.  Musculoskeletal: Negative for back pain.       Anterior chest wall tenderness on palpation.  Rt calf pain faint  For one month.  Skin: Negative for rash.  Neurological: Negative for dizziness, seizures and headaches.  Hematological: Negative for adenopathy. Does not bruise/bleed easily.  Psychiatric/Behavioral: Positive for dysphoric mood. Negative for behavioral problems and confusion. The patient is nervous/anxious.     Past Medical History:  Diagnosis Date  . Anxiety   . Bladder tumor   . Depression   . Hashimoto's thyroiditis    dx 01/ 2016-- endocrinologist-  dr Cruzita Lederer  . History of ovarian cyst   . Hypertension   . IBS (irritable bowel syndrome)      Social History   Social History  . Marital status: Married    Spouse name: N/A  . Number of children: N/A  . Years of education: N/A   Occupational History  . Not on file.   Social History Main Topics  . Smoking status: Never Smoker  . Smokeless tobacco: Never  Used  . Alcohol use 1.2 oz/week    2 Shots of liquor per week  . Drug use: No  . Sexual activity: Yes    Birth control/ protection: Condom   Other Topics Concern  . Not on file   Social History Narrative  . No narrative on file    No past surgical history on file.  Family History  Problem Relation Age of Onset  . Hypertension Mother   . Hypertension Father   . Anxiety disorder Father   . Heart disease Maternal Grandmother 59    MI  . Heart disease Maternal Grandfather 18    MI  . Thyroid disease Paternal Aunt   . Cancer Paternal Aunt     breast  . Cancer Maternal Aunt     colon  . Alzheimer's disease Paternal Grandmother   . Thyroid disease Paternal Grandmother   . Cancer Paternal Grandfather 58    No Known Allergies  Current Outpatient Prescriptions on File Prior to Visit  Medication Sig Dispense Refill  . B Complex-C (B-COMPLEX WITH VITAMIN C) tablet Take 1 tablet by mouth daily.    . Cholecalciferol (VITAMIN D) 2000 UNITS CAPS Take 1 capsule by mouth daily.    . LO LOESTRIN FE 1 MG-10 MCG / 10 MCG tablet   4  . losartan (COZAAR) 50 MG tablet Take 1 tablet (50 mg total) by mouth daily. 90 tablet  1  . Multiple Vitamin (MULTIVITAMIN) tablet Take 1 tablet by mouth daily.      Marland Kitchen OVER THE COUNTER MEDICATION Take 2-3 capsules by mouth 2 (two) times daily with a meal. Digestive enzymes    . Probiotic Product (PROBIOTIC ACIDOPHILUS PO) Take 1 tablet by mouth daily.       No current facility-administered medications on file prior to visit.     Pulse 84   Temp 98.4 F (36.9 C) (Oral)   Ht 5\' 5"  (1.651 m)   Wt 161 lb (73 kg)   LMP 05/31/2016   SpO2 98%   BMI 26.79 kg/m       Objective:   Physical Exam  General Mental Status- Alert. General Appearance- Not in acute distress.   Skin General: Color- Normal Color. Moisture- Normal Moisture.  Neck Carotid Arteries- Normal color. Moisture- Normal Moisture. No carotid bruits. No JVD.  Chest and Lung  Exam Auscultation: Breath Sounds:-Normal.  Cardiovascular Auscultation:Rythm- Regular. Murmurs & Other Heart Sounds:Auscultation of the heart reveals- No Murmurs.  Abdomen Inspection:-Inspeection Normal. Palpation/Percussion:Note:No mass. Palpation and Percussion of the abdomen reveal- Non Tender, Non Distended + BS, no rebound or guarding.    Neurologic Cranial Nerve exam:- CN III-XII intact(No nystagmus), symmetric smile. Drift Test:- No drift. Finger to Nose:- Normal/Intact Strength:- 5/5 equal and symmetric strength both upper and lower extremities.  Rt lower ext- neg homans sign. But rt calf faint tender to palpation. Calf is not swollen.Synmmetric compared to the left side.  Anterior chest- mild tenderness to palpation left costochondral junction.      Assessment & Plan:  For your recent anxiety will rx xanax .5 mg to use every 8 hours as needed. After your trip if mood depressed or anxiety will consider adding ssri type med and refilling your xanax.  For you increased bp I want you to increase cozaar to 100 mg a day.  Your anterior chest soreness I think is muscular pain but in light of recent events wanted to do ekg.   Pt  ekg was normal except for  one area. This is likely normal variant(negative t wave v1 and v2. No prior I computer to compare) but want to do labs. Will go over lab when in. If any worse chest pain then ED evaluation.  For your calf pain will get rt lower ext [place] today.  While on the air plane tomorrow I want you to walk and stretch when you can.  Follow up in 7-10 days or as needed

## 2016-07-06 ENCOUNTER — Encounter: Payer: Self-pay | Admitting: Family Medicine

## 2016-07-06 NOTE — Telephone Encounter (Signed)
We have 2 counselors here--- please give her the number for [facility]--- she can ask for Karna Christmas or Almyra Free We can send her the list we have too if she prefers to pick one off there

## 2016-07-07 ENCOUNTER — Telehealth: Payer: Self-pay | Admitting: Family Medicine

## 2016-07-07 NOTE — Telephone Encounter (Signed)
-----   Message from Katha Hamming sent at 07/07/2016 10:43 AM EST ----- Regarding: venous ultrasound We have left three messages for Adayah and she has not called [place] back to schedule.  Thanks, Hoyle Sauer

## 2016-07-09 ENCOUNTER — Encounter (HOSPITAL_BASED_OUTPATIENT_CLINIC_OR_DEPARTMENT_OTHER): Payer: Self-pay | Admitting: *Deleted

## 2016-07-09 NOTE — Progress Notes (Addendum)
NPO AFTER MN.  ARRIVE AT 1030.  NEEDS CBC, BMET, PTT, PT/INR, URINE PREG. .  CURRENT EKG IN CHART AND EPIC. MAY TAKE TYLENOL / XANAX IF NEEDED W/ SIPS OF WATER AM DOS.

## 2016-07-12 ENCOUNTER — Ambulatory Visit (HOSPITAL_BASED_OUTPATIENT_CLINIC_OR_DEPARTMENT_OTHER): Payer: BC Managed Care – PPO | Admitting: Anesthesiology

## 2016-07-12 ENCOUNTER — Ambulatory Visit (HOSPITAL_BASED_OUTPATIENT_CLINIC_OR_DEPARTMENT_OTHER)
Admission: RE | Admit: 2016-07-12 | Discharge: 2016-07-12 | Disposition: A | Discharge status: Home / Self Care | Payer: BC Managed Care – PPO | Source: Ambulatory Visit | Attending: Urology | Admitting: Urology

## 2016-07-12 ENCOUNTER — Encounter (HOSPITAL_BASED_OUTPATIENT_CLINIC_OR_DEPARTMENT_OTHER): Payer: Self-pay

## 2016-07-12 ENCOUNTER — Encounter (HOSPITAL_BASED_OUTPATIENT_CLINIC_OR_DEPARTMENT_OTHER)
Admission: RE | Disposition: A | Discharge status: Home / Self Care | Payer: Self-pay | Source: Ambulatory Visit | Attending: Urology

## 2016-07-12 DIAGNOSIS — K219 Gastro-esophageal reflux disease without esophagitis: Secondary | ICD-10-CM | POA: Diagnosis not present

## 2016-07-12 DIAGNOSIS — C672 Malignant neoplasm of lateral wall of bladder: Secondary | ICD-10-CM | POA: Insufficient documentation

## 2016-07-12 DIAGNOSIS — F17200 Nicotine dependence, unspecified, uncomplicated: Secondary | ICD-10-CM | POA: Insufficient documentation

## 2016-07-12 DIAGNOSIS — Z803 Family history of malignant neoplasm of breast: Secondary | ICD-10-CM | POA: Insufficient documentation

## 2016-07-12 DIAGNOSIS — Z79899 Other long term (current) drug therapy: Secondary | ICD-10-CM | POA: Insufficient documentation

## 2016-07-12 DIAGNOSIS — R310 Gross hematuria: Secondary | ICD-10-CM | POA: Insufficient documentation

## 2016-07-12 DIAGNOSIS — F419 Anxiety disorder, unspecified: Secondary | ICD-10-CM | POA: Diagnosis not present

## 2016-07-12 DIAGNOSIS — I10 Essential (primary) hypertension: Secondary | ICD-10-CM | POA: Diagnosis not present

## 2016-07-12 DIAGNOSIS — Z8249 Family history of ischemic heart disease and other diseases of the circulatory system: Secondary | ICD-10-CM | POA: Diagnosis not present

## 2016-07-12 DIAGNOSIS — D494 Neoplasm of unspecified behavior of bladder: Secondary | ICD-10-CM

## 2016-07-12 DIAGNOSIS — Z8349 Family history of other endocrine, nutritional and metabolic diseases: Secondary | ICD-10-CM | POA: Diagnosis not present

## 2016-07-12 HISTORY — DX: Autoimmune thyroiditis: E06.3

## 2016-07-12 HISTORY — DX: Depression, unspecified: F32.A

## 2016-07-12 HISTORY — DX: Neoplasm of unspecified behavior of bladder: D49.4

## 2016-07-12 HISTORY — DX: Personal history of other diseases of the female genital tract: Z87.42

## 2016-07-12 HISTORY — PX: TRANSURETHRAL RESECTION OF BLADDER TUMOR: SHX2575

## 2016-07-12 HISTORY — DX: Major depressive disorder, single episode, unspecified: F32.9

## 2016-07-12 HISTORY — DX: Presence of spectacles and contact lenses: Z97.3

## 2016-07-12 LAB — BASIC METABOLIC PANEL
ANION GAP: 9 (ref 5–15)
BUN: 12 mg/dL (ref 6–20)
CALCIUM: 8.7 mg/dL — AB (ref 8.9–10.3)
CO2: 22 mmol/L (ref 22–32)
Chloride: 107 mmol/L (ref 101–111)
Creatinine, Ser: 0.79 mg/dL (ref 0.44–1.00)
GFR calc Af Amer: >60 mL/min (ref >60)
GFR calc non Af Amer: >60 mL/min (ref >60)
GLUCOSE: 80 mg/dL (ref 65–99)
POTASSIUM: 3.9 mmol/L (ref 3.5–5.1)
Sodium: 138 mmol/L (ref 135–145)

## 2016-07-12 LAB — CBC
HEMATOCRIT: 41.5 % (ref 36.0–46.0)
Hemoglobin: 14.4 g/dL (ref 12.0–15.0)
MCH: 29.2 pg (ref 26.0–34.0)
MCHC: 34.7 g/dL (ref 30.0–36.0)
MCV: 84.2 fL (ref 78.0–100.0)
PLATELETS: 303 10*3/uL (ref 150–400)
RBC: 4.93 MIL/uL (ref 3.87–5.11)
RDW: 12.4 % (ref 11.5–15.5)
WBC: 7.6 10*3/uL (ref 4.0–10.5)

## 2016-07-12 LAB — POCT PREGNANCY, URINE: PREG TEST UR: NEGATIVE

## 2016-07-12 LAB — PROTIME-INR
INR: 1
Prothrombin Time: 13.2 seconds (ref 11.4–15.2)

## 2016-07-12 LAB — APTT: APTT: 24 s (ref 24–36)

## 2016-07-12 SURGERY — TURBT (TRANSURETHRAL RESECTION OF BLADDER TUMOR)
Anesthesia: General | Site: Bladder

## 2016-07-12 MED ORDER — DEXAMETHASONE SODIUM PHOSPHATE 4 MG/ML IJ SOLN
INTRAMUSCULAR | Status: DC | PRN
  Administered 2016-07-12: 10 mg via INTRAVENOUS

## 2016-07-12 MED ORDER — SCOPOLAMINE 1 MG/3DAYS TD PT72
MEDICATED_PATCH | TRANSDERMAL | Status: AC
  Filled 2016-07-12: qty 1

## 2016-07-12 MED ORDER — ONDANSETRON HCL 4 MG/2ML IJ SOLN
INTRAMUSCULAR | Status: DC | PRN
  Administered 2016-07-12: 4 mg via INTRAVENOUS

## 2016-07-12 MED ORDER — FENTANYL CITRATE (PF) 100 MCG/2ML IJ SOLN
25.0000 ug | INTRAMUSCULAR | Status: DC | PRN
  Filled 2016-07-12: qty 1

## 2016-07-12 MED ORDER — PROMETHAZINE HCL 25 MG/ML IJ SOLN
6.2500 mg | INTRAMUSCULAR | Status: DC | PRN
  Filled 2016-07-12: qty 1

## 2016-07-12 MED ORDER — LACTATED RINGERS IV SOLN
INTRAVENOUS | Status: DC
  Administered 2016-07-12: 12:00:00 via INTRAVENOUS
  Filled 2016-07-12: qty 1000

## 2016-07-12 MED ORDER — HYDROCODONE-ACETAMINOPHEN 5-325 MG PO TABS: 1.0000 | ORAL_TABLET | ORAL | 0 refills | Status: DC | PRN

## 2016-07-12 MED ORDER — LIDOCAINE 2% (20 MG/ML) 5 ML SYRINGE
INTRAMUSCULAR | Status: AC
  Filled 2016-07-12: qty 5

## 2016-07-12 MED ORDER — MIDAZOLAM HCL 2 MG/2ML IJ SOLN
INTRAMUSCULAR | Status: AC
  Filled 2016-07-12: qty 2

## 2016-07-12 MED ORDER — PROPOFOL 10 MG/ML IV BOLUS
INTRAVENOUS | Status: DC | PRN
  Administered 2016-07-12: 150 mg via INTRAVENOUS
  Administered 2016-07-12: 50 mg via INTRAVENOUS

## 2016-07-12 MED ORDER — CEFAZOLIN SODIUM-DEXTROSE 2-4 GM/100ML-% IV SOLN
2.0000 g | INTRAVENOUS | Status: AC
  Administered 2016-07-12: 2 g via INTRAVENOUS
  Filled 2016-07-12: qty 100

## 2016-07-12 MED ORDER — MIDAZOLAM HCL 5 MG/5ML IJ SOLN
INTRAMUSCULAR | Status: DC | PRN
  Administered 2016-07-12: 2 mg via INTRAVENOUS

## 2016-07-12 MED ORDER — FENTANYL CITRATE (PF) 100 MCG/2ML IJ SOLN
INTRAMUSCULAR | Status: AC
  Filled 2016-07-12: qty 2

## 2016-07-12 MED ORDER — PROPOFOL 10 MG/ML IV BOLUS
INTRAVENOUS | Status: AC
  Filled 2016-07-12: qty 20

## 2016-07-12 MED ORDER — FENTANYL CITRATE (PF) 100 MCG/2ML IJ SOLN
INTRAMUSCULAR | Status: DC | PRN
  Administered 2016-07-12: 25 ug via INTRAVENOUS
  Administered 2016-07-12: 50 ug via INTRAVENOUS

## 2016-07-12 MED ORDER — CEFAZOLIN SODIUM-DEXTROSE 2-4 GM/100ML-% IV SOLN
INTRAVENOUS | Status: AC
  Filled 2016-07-12: qty 100

## 2016-07-12 MED ORDER — LIDOCAINE HCL (CARDIAC) 20 MG/ML IV SOLN: INTRAVENOUS | Status: DC | PRN

## 2016-07-12 MED ORDER — SCOPOLAMINE 1 MG/3DAYS TD PT72
1.0000 | MEDICATED_PATCH | TRANSDERMAL | Status: DC
  Administered 2016-07-12: 1.5 mg via TRANSDERMAL
  Filled 2016-07-12: qty 1

## 2016-07-12 MED ORDER — SCOPOLAMINE 1 MG/3DAYS TD PT72
MEDICATED_PATCH | TRANSDERMAL | Status: DC | PRN
  Administered 2016-07-12: 1 via TRANSDERMAL

## 2016-07-12 MED ORDER — LIDOCAINE 2% (20 MG/ML) 5 ML SYRINGE
INTRAMUSCULAR | Status: DC | PRN
  Administered 2016-07-12: 60 mg via INTRAVENOUS

## 2016-07-12 MED ORDER — SODIUM CHLORIDE 0.9 % IR SOLN
Status: DC | PRN
  Administered 2016-07-12: 6000 mL

## 2016-07-12 MED ORDER — CEPHALEXIN 500 MG PO CAPS: 500.0000 mg | ORAL_CAPSULE | Freq: Three times a day (TID) | ORAL | 0 refills | Status: DC

## 2016-07-12 SURGICAL SUPPLY — 30 items
BAG DRAIN URO-CYSTO SKYTR STRL (DRAIN) ×3 IMPLANT
BAG URINE DRAINAGE (UROLOGICAL SUPPLIES) IMPLANT
BAG URINE LEG 500ML (DRAIN) IMPLANT
CATH FOLEY 2WAY SLVR  5CC 22FR (CATHETERS)
CATH FOLEY 2WAY SLVR 30CC 20FR (CATHETERS) IMPLANT
CATH FOLEY 2WAY SLVR 5CC 22FR (CATHETERS) IMPLANT
CATH INTERMIT  6FR 70CM (CATHETERS) ×3 IMPLANT
CLOTH BEACON ORANGE TIMEOUT ST (SAFETY) ×3 IMPLANT
ELECT BIVAP BIPO 22/24 DONUT (ELECTROSURGICAL)
ELECT REM PT RETURN 9FT ADLT (ELECTROSURGICAL) ×3
ELECTRD BIVAP BIPO 22/24 DONUT (ELECTROSURGICAL) IMPLANT
ELECTRODE REM PT RTRN 9FT ADLT (ELECTROSURGICAL) ×1 IMPLANT
EVACUATOR MICROVAS BLADDER (UROLOGICAL SUPPLIES) IMPLANT
GLOVE BIO SURGEON STRL SZ7.5 (GLOVE) ×3 IMPLANT
GLOVE BIOGEL PI IND STRL 7.0 (GLOVE) ×2 IMPLANT
GLOVE BIOGEL PI INDICATOR 7.0 (GLOVE) ×4
GLOVE ECLIPSE 7.0 STRL STRAW (GLOVE) ×3 IMPLANT
GOWN STRL REUS W/ TWL LRG LVL3 (GOWN DISPOSABLE) ×1 IMPLANT
GOWN STRL REUS W/ TWL XL LVL3 (GOWN DISPOSABLE) ×1 IMPLANT
GOWN STRL REUS W/TWL LRG LVL3 (GOWN DISPOSABLE) ×2
GOWN STRL REUS W/TWL XL LVL3 (GOWN DISPOSABLE) ×2
GUIDEWIRE STR DUAL SENSOR (WIRE) ×3 IMPLANT
KIT ROOM TURNOVER WOR (KITS) ×3 IMPLANT
LOOP CUT BIPOLAR 24F LRG (ELECTROSURGICAL) ×3 IMPLANT
MANIFOLD NEPTUNE II (INSTRUMENTS) ×3 IMPLANT
PACK CYSTO (CUSTOM PROCEDURE TRAY) ×3 IMPLANT
SET ASPIRATION TUBING (TUBING) IMPLANT
SYRINGE IRR TOOMEY STRL 70CC (SYRINGE) IMPLANT
TUBE CONNECTING 12'X1/4 (SUCTIONS)
TUBE CONNECTING 12X1/4 (SUCTIONS) IMPLANT

## 2016-07-12 NOTE — Telephone Encounter (Signed)
Give number for [facility] off list

## 2016-07-12 NOTE — Anesthesia Preprocedure Evaluation (Addendum)
Anesthesia Evaluation  Patient identified by MRN, date of birth, ID band Patient awake    Reviewed: Allergy & Precautions, NPO status , Patient's Chart, lab work & pertinent test results  Airway Mallampati: II  TM Distance: >3 FB Neck ROM: Full    Dental  (+) Dental Advisory Given, Teeth Intact   Pulmonary neg pulmonary ROS,    breath sounds clear to auscultation       Cardiovascular Exercise Tolerance: Good hypertension, Pt. on medications  Rhythm:Regular Rate:Normal     Neuro/Psych Anxiety negative neurological ROS     GI/Hepatic negative GI ROS, Neg liver ROS,   Endo/Other  negative endocrine ROS  Renal/GU negative Renal ROS     Musculoskeletal   Abdominal   Peds  Hematology negative hematology ROS (+)   Anesthesia Other Findings   Reproductive/Obstetrics                           Anesthesia Physical Anesthesia Plan  ASA: II  Anesthesia Plan: General   Post-op Pain Management:    Induction: Intravenous  Airway Management Planned: LMA  Additional Equipment:   Intra-op Plan:   Post-operative Plan: Extubation in OR  Informed Consent: I have reviewed the patients History and Physical, chart, labs and discussed the procedure including the risks, benefits and alternatives for the proposed anesthesia with the patient or authorized representative who has indicated his/her understanding and acceptance.   Dental advisory given  Plan Discussed with: CRNA  Anesthesia Plan Comments:         Anesthesia Quick Evaluation

## 2016-07-12 NOTE — Transfer of Care (Signed)
Immediate Anesthesia Transfer of Care Note  Patient: Jacqueline Calhoun  Procedure(s) Performed: Procedure(s): TRANSURETHRAL RESECTION OF BLADDER TUMOR (TURBT) (N/A)  Patient Location: PACU  Anesthesia Type:General  Level of Consciousness: awake, alert , oriented and patient cooperative  Airway & Oxygen Therapy: Patient Spontanous Breathing and Patient connected to nasal cannula oxygen  Post-op Assessment: Report given to RN and Post -op Vital signs reviewed and stable  Post vital signs: Reviewed and stable  Last Vitals:  Vitals:   07/12/16 1042  BP: (!) 142/88  Pulse: 83  Resp: 16  Temp: 36.9 C    Last Pain:  Vitals:   07/12/16 1042  TempSrc: Oral      Patients Stated Pain Goal: 6 ([id number])  Complications: No apparent anesthesia complications

## 2016-07-12 NOTE — Discharge Instructions (Signed)
Transurethral Resection, Bladder Tumor A cancerous growth (tumor) can develop on the inside wall of the bladder. The bladder is the organ that holds urine. One way to remove the tumor is a procedure called a transurethral resection. The tumor is removed (resected) through the tube that carries urine from the bladder out of the body (urethra). No cuts (incisions) are made in the skin. Instead, the procedure is done through a thin telescope, called a resectoscope. Attached to it is a light and usually a tiny camera. The resectoscope is put into the urethra. In men, the urethra opens at the end of the penis. In women, it opens just above the vagina.  A transurethral resection is usually used to remove tumors that have not gotten too big or too deep. These are called Stage 0, Stage 1 or Stage 2 bladder cancers. LET YOUR CAREGIVER KNOW ABOUT:  On the day of the procedure, your caregivers will need to know the last time you had anything to eat or drink. This includes water, gum, and candy. In advance, make sure they know about:   Any allergies.  All medications you are taking, including:  Herbs, eyedrops, over-the-counter medications and creams.  Blood thinners (anticoagulants), aspirin or other drugs that could affect blood clotting.  Use of steroids (by mouth or as creams).  Previous problems with anesthetics, including local anesthetics.  Possibility of pregnancy, if this applies.  Any history of blood clots.  Any history of bleeding or other blood problems.  Previous surgery.  Smoking history.  Any recent symptoms of colds or infections.  Other health problems. RISKS AND COMPLICATIONS This is usually a safe procedure. Every procedure has risks, though. For a transurethral resection, they include:  Infection. Antibiotic medication would need to be taken.  Bleeding.  Light bleeding may last for several days after the procedure.  If bleeding continues or is heavy, the bladder  may need rinsing. Or, a new catheter might be put in for awhile.  Sometimes bed rest is needed.  Urination problems.  Pain and burning can occur when urinating. This usually goes away in a few days.  Scarring from the procedure can block the flow of urine.  Bladder damage.  It can be punctured or torn during removal of the tumor. If this happens, a catheter might be needed for longer. Antibiotics would be taken while the bladder heals.  Urine can leak through the hole or tear into the abdomen. If this happens, surgery may be needed to repair the bladder. BEFORE THE PROCEDURE   A medical evaluation will be done. This may include:  A physical examination.  Urine test. This is to make sure you do not have a urinary tract infection.  Blood tests.  A test that checks the heart's rhythm (electrocardiogram).  Talking with an anesthesiologist. This is the person who will be in charge of the medication (anesthesia) to keep you from feeling pain during the transurethral resection. You might be asleep during the procedure (general anesthesia) or numb from the waist down, but awake during the procedure (spinal anesthesia). Ask your surgeon what to expect.  The person who is having a transurethral resection needs to give what is called informed consent. This requires signing a legal paper that gives permission for the procedure. To give informed consent:  You must understand how the procedure is done and why.  You must be told all the risks and benefits of the procedure.  You must sign the consent. Sometimes a legal  guardian can do this.  Signing should be witnessed by a healthcare professional.  The day before the surgery, eat only a light dinner. Then, do not eat or drink anything for at least 8 hours before the surgery. Ask your caregiver if it is OK to take any needed medicines with a sip of water.  Arrive at least an hour before the surgery or whenever your surgeon recommends. This  will give you time to check in and fill out any needed paperwork. PROCEDURE  The preparation:  You will change into a hospital gown.  A needle will be inserted in your arm. This is an intravenous access tube (IV). Medication will be able to flow directly into your body through this needle.  Small monitors will be put on your body. They are used to check your heart, blood pressure, and oxygen level.  You might be given medication that will help you relax (sedative).  You will be given a general anesthetic or spinal anesthesia.  The procedure:  Once you are asleep or numb from the waist down, your legs will be placed in stirrups.  The resectoscope will be passed through the urethra into the bladder.  Fluid will be passed through the resectoscope. This will fill the bladder with water.  The surgeon will examine the bladder through the scope. If the scope has a camera, it can take pictures from inside the bladder. They can be projected onto a TV screen.  The surgeon will use various tools to remove the tumor in small pieces. Sometimes a laser (a beam of light energy) is used. Other tools may use electric current.  A tube (catheter) will often be placed so that urine can drain into a bag outside the body. This process helps stop bleeding. This tube keeps blood clots from blocking the urethra.  The procedure usually takes 30 to 45 minutes. AFTER THE PROCEDURE   You will stay in a recovery area until the anesthesia has worn off. Your blood pressure and pulse will be checked every so often. Then you will be taken to a hospital room.  You may continue to get fluids through the IV for awhile.  Some pain is normal. The catheter might be uncomfortable. Pain is usually not severe. If it is, ask for pain medicine.  Your urine may look bloody after a transurethral resection. This is normal.  If bleeding is heavy, a hospital caregiver may rinse out the bladder (irrigation) through the  catheter.  Once the urine is clear, the catheter will be taken out.  You will need to stay in the hospital until you can urinate on your own.  Most people stay in the hospital for up to 4 days. PROGNOSIS   Transurethral resection is considered the best way to treat bladder tumors that are not too far along. For most people, the treatment is successful. Sometimes, though, more treatment is needed.  Bladder cancers can come back even after a successful procedure. Because of this, be sure to have a checkup with your caregiver every 3 to 6 months. If everything is OK for 3 years, you can reduce the checkups to once a year.   This information is not intended to replace advice given to you by your health care provider. Make sure you discuss any questions you have with your health care provider.   Document Released: 06/12/2009 Document Revised: 11/08/2011 Document Reviewed: 08/18/2009 Elsevier Interactive Patient Education 2016 [place] [facility] Care Instructions  Activity: Get  plenty of rest for the remainder of the day. A responsible adult should stay with you for 24 hours following the procedure.  For the next 24 hours, DO NOT: -Drive a car -Paediatric nurse -Drink alcoholic beverages -Take any medication unless instructed by your physician -Make any legal decisions or sign important papers.  Meals: Start with liquid foods such as gelatin or soup. Progress to regular foods as tolerated. Avoid greasy, spicy, heavy foods. If nausea and/or vomiting occur, drink only clear liquids until the nausea and/or vomiting subsides. Call your physician if vomiting continues.  Special Instructions/Symptoms: Your throat may feel dry or sore from the anesthesia or the breathing tube placed in your throat during surgery. If this causes discomfort, gargle with warm salt water. The discomfort should disappear within 24 hours.  If you had a scopolamine patch placed behind your ear for  the management of post- operative nausea and/or vomiting:  1. The medication in the patch is effective for 72 hours, after which it should be removed.  Wrap patch in a tissue and discard in the trash. Wash hands thoroughly with soap and water. 2. You may remove the patch earlier than 72 hours if you experience unpleasant side effects which may include dry mouth, dizziness or visual disturbances. 3. Avoid touching the patch. Wash your hands with soap and water after contact with the patch.

## 2016-07-12 NOTE — Anesthesia Postprocedure Evaluation (Signed)
Anesthesia Post Note  Patient: Jacqueline Calhoun  Procedure(s) Performed: Procedure(s) (LRB): TRANSURETHRAL RESECTION OF BLADDER TUMOR (TURBT) (N/A)  Patient location during evaluation: PACU Anesthesia Type: General Level of consciousness: awake and alert Pain management: pain level controlled Vital Signs Assessment: post-procedure vital signs reviewed and stable Respiratory status: spontaneous breathing, nonlabored ventilation, respiratory function stable and patient connected to nasal cannula oxygen Cardiovascular status: blood pressure returned to baseline and stable Postop Assessment: no signs of nausea or vomiting Anesthetic complications: no    Last Vitals:  Vitals:   07/12/16 1345 07/12/16 1400  BP: (!) 152/90 (!) 148/93  Pulse: 71 72  Resp: 15 15  Temp:      Last Pain:  Vitals:   07/12/16 1042  TempSrc: Oral                 Tiajuana Amass

## 2016-07-12 NOTE — Anesthesia Procedure Notes (Signed)
Procedure Name: LMA Insertion Date/Time: 07/12/2016 12:30 PM Performed by: Wanita Chamberlain Pre-anesthesia Checklist: Patient identified, Timeout performed, Emergency Drugs available, Suction available and Patient being monitored Patient Re-evaluated:Patient Re-evaluated prior to inductionOxygen Delivery Method: Circle system utilized Preoxygenation: Pre-oxygenation with 100% oxygen Intubation Type: IV induction Ventilation: Mask ventilation without difficulty LMA: LMA inserted LMA Size: 4.0 Number of attempts: 1 Placement Confirmation: CO2 detector and breath sounds checked- equal and bilateral Tube secured with: Tape Dental Injury: Teeth and Oropharynx as per pre-operative assessment

## 2016-07-12 NOTE — Op Note (Signed)
Date of procedure: 07/12/16  Preoperative diagnosis:  1. Bladder tumor   Postoperative diagnosis:  1. Bladder tumor   Procedure: 1. Transurethral resection of bladder tumor 2 cm  Surgeon: Baruch Gouty, MD  Anesthesia: General  Complications: None  Intraoperative findings: The patient had a 2 cm bladder tumor in the left lateral wall of her bladder that was recently resected. Muscle is visible in the specimen.  EBL: None  Specimens: Bladder tumor  Drains: None  Disposition: Stable to the postanesthesia care unit  Indication for procedure: The patient is a 40 y.o. female with a 2 cm bladder tumor that was found during workup for gross hematuria presents today for surgical excision.  After reviewing the management options for treatment, the patient elected to proceed with the above surgical procedure(s). We have discussed the potential benefits and risks of the procedure, side effects of the proposed treatment, the likelihood of the patient achieving the goals of the procedure, and any potential problems that might occur during the procedure or recuperation. Informed consent has been obtained.  Description of procedure: The patient was met in the preoperative area. All risks, benefits, and indications of the procedure were described in great detail. The patient consented to the procedure. Preoperative antibiotics were given. The patient was taken to the operative theater. General anesthesia was induced per the anesthesia service. The patient was then placed in the dorsal lithotomy position and prepped and draped in the usual sterile fashion. A preoperative timeout was called.   A 24 French 30 resectoscope sheath with visual obturator was placed per urethra atraumatically. Pan cystoscopy revealed a bladder tumor in the left lateral wall especially 2 cm in size. This was resected down past muscle. A small amount of fat was seen. This was sent and to pathology. Hemostasis was then obtained  which was excellent. Clear yellow urine effluxing from both ureters at the end of the procedure. The patient was awoke from anesthesia and transferred stable condition to the postanesthesia care unit.  Plan:  The patient will follow-up in one week for discussion of pathology results.  Baruch Gouty, M.D.

## 2016-07-12 NOTE — H&P (Signed)
CC: I have blood in my urine.  HPI: Jacqueline Calhoun is a 40 year-old female established patient who is here for blood in the urine.  She did see the blood in her urine. She has seen blood clots.   She does not have a burning sensation when she urinates. She is not currently having trouble urinating.   She has not had kidney stones. She is not having pain. She has not recently had unwanted weight loss.   patient with approximate 1 week history of gross hematuria. She's had no pain. She has passed a few pea-sized clots but nothing larger. She has never had this problem before. She has no history of nephrolithiasis or urinary dysfunction. She has never seen a urologist before. Urinalysis today does not show infection but does show a large amount of red blood cells.   Since her first visit, her hematuria has resolved.    CT showed a concerning lesion in the left superior wall of the bladder. Upper tracts were normal.     ALLERGIES: None   MEDICATIONS: Ibuprofen  Lo Loestrin Fe  Losartan Potassium 50 mg tablet  Probiotics  Tylenol  Vitamin B Complex  Vitamin C  Vitamin D     GU PSH: Locm 300-399Mg /Ml Iodine,1Ml - 06/24/2016    NON-GU PSH: None       GU PMH: Gross hematuria - 06/15/2016    NON-GU PMH: Anxiety GERD Hypertension    FAMILY HISTORY: Breast Cancer - Runs in Family Heart Disease - Grandfather Thyroid Disease - Runs in Family   SOCIAL HISTORY: Marital Status: Married Current Smoking Status: Patient has never smoked.  Social Drinker.  Drinks 2 caffeinated drinks per day. Patient's occupation Community education officer.    REVIEW OF SYSTEMS:    GU Review Female:  Patient denies frequent urination, hard to postpone urination, burning /pain with urination, get up at night to urinate, leakage of urine, stream starts and stops, trouble starting your stream, have to strain to urinate, and currently pregnant.   Gastrointestinal (Upper):  Patient denies nausea, vomiting, and indigestion/  heartburn.   Gastrointestinal (Lower):  Patient denies diarrhea and constipation.   Constitutional:  Patient denies weight loss, fever, fatigue, and night sweats.   Skin:  Patient denies skin rash/ lesion and itching.   Eyes:  Patient denies blurred vision and double vision.   Ears/ Nose/ Throat:  Patient reports sinus problems. Patient denies sore throat.   Hematologic/Lymphatic:  Patient denies swollen glands and easy bruising.   Cardiovascular:  Patient denies leg swelling and chest pains.   Respiratory:  Patient denies cough and shortness of breath.   Endocrine:  Patient denies excessive thirst.   Musculoskeletal:  Patient denies back pain and joint pain.   Neurological:  Patient denies headaches and dizziness.   Psychologic:  Patient reports anxiety. Patient denies depression.   VITAL SIGNS:      06/29/2016 03:41 PM    BP 140/100 mmHg    Pulse 80 /min    Temperature 98.8 F / 37 C    MULTI-SYSTEM PHYSICAL EXAMINATION:     Constitutional: Well-nourished. No physical deformities. Normally developed. Good grooming.    Neck: Neck symmetrical, not swollen. Normal tracheal position.    Respiratory: No labored breathing, no use of accessory muscles.     Cardiovascular: Normal temperature, normal extremity pulses, no swelling, no varicosities.    Skin: No paleness, no jaundice, no cyanosis. No lesion, no ulcer, no rash.    Gastrointestinal: No mass, no tenderness,  no rigidity, non obese abdomen.    Eyes: Normal conjunctivae. Normal eyelids.    Ears, Nose, Mouth, and Throat: Left ear no scars, no lesions, no masses. Right ear no scars, no lesions, no masses. Nose no scars, no lesions, no masses. Normal hearing. Normal lips.    Musculoskeletal: Normal gait and station of head and neck.          PAST DATA REVIEWED:   Source Of History:  Patient  X-Ray Review: C.T. Hematuria: Reviewed Films. Reviewed Report. Discussed With Patient.     PROCEDURES:    Flexible Cystoscopy - 52000   Risks, benefits, and some of the potential complications of the procedure were discussed at length with the patient including infection, bleeding, voiding discomfort, urinary retention, fever, chills, sepsis, and others. All questions were answered. Informed consent was obtained. Antibiotic prophylaxis was given. Sterile technique and intraurethral analgesia were used.  Meatus:  Normal size. Normal location. Normal condition.  Urethra:  No hypermobility. No leakage.  Ureteral Orifices:  Normal location. Normal size. Normal shape. Effluxed clear urine.  Bladder:  A left superior lateral wall tumor. 1 cm tumor. Solitary tumor. Exophytic. No trabeculation. Normal mucosa. No stones.      The lower urinary tract was carefully examined. The procedure was well-tolerated and without complications. Antibiotic instructions were given. Instructions were given to call the office immediately for bloody urine, difficulty urinating, urinary retention, painful or frequent urination, fever, chills, nausea, vomiting or other illness. The patient stated that she understood these instructions and would comply with them.    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 She understands the risks include but are not limited to bleeding, infection, need for  Foley catheter, and need for repeat procedures. All questions were answered. The patient is agreeable to proceeding

## 2016-07-13 ENCOUNTER — Encounter (HOSPITAL_BASED_OUTPATIENT_CLINIC_OR_DEPARTMENT_OTHER): Payer: Self-pay | Admitting: Urology

## 2016-07-14 ENCOUNTER — Ambulatory Visit (HOSPITAL_BASED_OUTPATIENT_CLINIC_OR_DEPARTMENT_OTHER)
Admission: RE | Admit: 2016-07-14 | Discharge: 2016-07-14 | Disposition: A | Discharge status: Home / Self Care | Payer: BC Managed Care – PPO | Source: Ambulatory Visit | Attending: Medical | Admitting: Medical

## 2016-07-14 DIAGNOSIS — M79661 Pain in right lower leg: Secondary | ICD-10-CM | POA: Diagnosis present

## 2016-07-14 NOTE — Telephone Encounter (Signed)
Ok to refill xanax x1

## 2016-07-14 NOTE — Telephone Encounter (Signed)
Dr Carollee Herter-- pt is asking for refill of Xanax. Last Rx was 07/01/16, #21. Please see above email and advise?  Emailed additional contact #s to pt.

## 2016-07-16 ENCOUNTER — Other Ambulatory Visit: Payer: Self-pay | Admitting: Medical

## 2016-07-16 MED ORDER — ALPRAZOLAM 0.5 MG PO TABS: 0.5000 mg | ORAL_TABLET | Freq: Three times a day (TID) | ORAL | 0 refills | Status: DC | PRN

## 2016-07-16 NOTE — Telephone Encounter (Signed)
Patient requesting Jacqueline Calhoun  Last seen 07/01/16 Last filled #21- 0 rf 07/01/16 Sig : tak  Tab po ID prn for anxiety Please advise PC

## 2016-07-16 NOTE — Telephone Encounter (Signed)
Rx faxed PC

## 2016-07-19 ENCOUNTER — Telehealth: Payer: Self-pay | Admitting: Behavioral Health

## 2016-07-19 MED ORDER — ALPRAZOLAM 0.5 MG PO TABS: 0.5000 mg | ORAL_TABLET | Freq: Three times a day (TID) | ORAL | 0 refills | Status: DC | PRN

## 2016-07-19 NOTE — Telephone Encounter (Signed)
Patient voiced that she requested Rx for Xanax, but it was sent to the wrong pharmacy. PCP made aware and advised to reprint and send to the patient's preferred pharmacy.   Rx printed and faxed to [facility] [phone].

## 2016-08-11 ENCOUNTER — Other Ambulatory Visit: Payer: Self-pay | Admitting: Medical

## 2016-08-12 ENCOUNTER — Ambulatory Visit (INDEPENDENT_AMBULATORY_CARE_PROVIDER_SITE_OTHER): Payer: BC Managed Care – PPO | Admitting: Family Medicine

## 2016-08-12 ENCOUNTER — Encounter: Payer: Self-pay | Admitting: Family Medicine

## 2016-08-12 VITALS — BP 142/88 | HR 79 | Temp 98.4°F | Resp 16 | Ht 65.0 in | Wt 162.8 lb

## 2016-08-12 DIAGNOSIS — R109 Unspecified abdominal pain: Secondary | ICD-10-CM | POA: Diagnosis not present

## 2016-08-12 DIAGNOSIS — R319 Hematuria, unspecified: Secondary | ICD-10-CM

## 2016-08-12 DIAGNOSIS — I10 Essential (primary) hypertension: Secondary | ICD-10-CM

## 2016-08-12 LAB — POC URINALSYSI DIPSTICK (AUTOMATED)
Bilirubin, UA: NEGATIVE
Blood, UA: NEGATIVE
Glucose, UA: NEGATIVE
KETONES UA: NEGATIVE
NITRITE UA: NEGATIVE
PH UA: 6
PROTEIN UA: NEGATIVE
Spec Grav, UA: 1.02
UROBILINOGEN UA: NEGATIVE

## 2016-08-12 MED ORDER — CIPROFLOXACIN HCL 250 MG PO TABS: 250.0000 mg | ORAL_TABLET | Freq: Two times a day (BID) | ORAL | 0 refills | Status: DC

## 2016-08-12 NOTE — Patient Instructions (Signed)
Flank Pain Flank pain refers to pain that is located on the side of the body between the upper abdomen and the back. The pain may occur over a short period of time (acute) or may be long-term or reoccurring (chronic). It may be mild or severe. Flank pain can be caused by many things. CAUSES  Some of the more common causes of flank pain include:  Muscle strains.   Muscle spasms.   A disease of your spine (vertebral disk disease).   A lung infection (pneumonia).   Fluid around your lungs (pulmonary edema).   A kidney infection.   Kidney stones.   A very painful skin rash caused by the chickenpox virus (shingles).   Gallbladder disease.  HOME CARE INSTRUCTIONS  Home care will depend on the cause of your pain. In general,  Rest as directed by your caregiver.  Drink enough fluids to keep your urine clear or pale yellow.  Only take over-the-counter or prescription medicines as directed by your caregiver. Some medicines may help relieve the pain.  Tell your caregiver about any changes in your pain.  Follow up with your caregiver as directed. SEEK IMMEDIATE MEDICAL CARE IF:   Your pain is not controlled with medicine.   You have new or worsening symptoms.  Your pain increases.   You have abdominal pain.   You have shortness of breath.   You have persistent nausea or vomiting.   You have swelling in your abdomen.   You feel faint or pass out.   You have blood in your urine.  You have a fever or persistent symptoms for more than 2-3 days.  You have a fever and your symptoms suddenly get worse. MAKE SURE YOU:   Understand these instructions.  Will watch your condition.  Will get help right away if you are not doing well or get worse. This information is not intended to replace advice given to you by your health care provider. Make sure you discuss any questions you have with your health care provider. Document Released: 10/07/2005 Document  Revised: 05/10/2012 Document Reviewed: 05/20/2015 Elsevier Interactive Patient Education  2017 [employer]  

## 2016-08-12 NOTE — Progress Notes (Signed)
Patient ID: Jacqueline Calhoun, female    DOB: [birth year]  Age: 40 y.o. MRN: [id number]    Subjective:  Subjective  HPI Jacqueline Calhoun presents for flank pain and blood in the urine.  No dysuria, no fever.    Review of Systems  Constitutional: Negative for appetite change, diaphoresis, fatigue and unexpected weight change.  Eyes: Negative for pain, redness and visual disturbance.  Respiratory: Negative for cough, chest tightness, shortness of breath and wheezing.   Cardiovascular: Negative for chest pain, palpitations and leg swelling.  Endocrine: Negative for cold intolerance, heat intolerance, polydipsia, polyphagia and polyuria.  Genitourinary: Positive for flank pain. Negative for difficulty urinating, dysuria and frequency.  Neurological: Negative for dizziness, light-headedness, numbness and headaches.    History Past Medical History:  Diagnosis Date  . Anxiety   . Bladder tumor   . Depression   . Hashimoto's thyroiditis    dx 01/ 2016-- endocrinologist-  dr Jacqueline Calhoun  . History of ovarian cyst   . Hypertension   . IBS (irritable bowel syndrome)    eat gluten free and take probiotic  . Wears glasses     She has a past surgical history that includes No past surgeries and Transurethral resection of bladder tumor (N/A, 07/12/2016).   Her family history includes Alzheimer's disease in her paternal grandmother; Anxiety disorder in her father; Cancer in her maternal aunt and paternal aunt; Cancer (age of onset: 74) in her paternal grandfather; Heart disease (age of onset: 66) in her maternal grandfather and maternal grandmother; Hypertension in her father and mother; Thyroid disease in her paternal aunt and paternal grandmother.She reports that she has never smoked. She has never used smokeless tobacco. She reports that she drinks alcohol. She reports that she does not use drugs.  Current Outpatient Prescriptions on File Prior to Visit  Medication Sig Dispense Refill  . acetaminophen  (TYLENOL) 500 MG tablet Take 1,000 mg by mouth every 6 (six) hours as needed.    . ALPRAZolam (XANAX) 0.5 MG tablet Take 1 tablet (0.5 mg total) by mouth 3 (three) times daily as needed for anxiety. 21 tablet 0  . B Complex-C (B-COMPLEX WITH VITAMIN C) tablet Take 1 tablet by mouth daily.    . Cholecalciferol (VITAMIN D) 2000 UNITS CAPS Take 1 capsule by mouth daily.    . Ibuprofen (ADVIL) 200 MG CAPS Take by mouth as needed.    . LO LOESTRIN FE 1 MG-10 MCG / 10 MCG tablet Take 1 tablet by mouth every evening.   4  . Multiple Vitamin (MULTIVITAMIN) tablet Take 1 tablet by mouth daily.      . Probiotic Product (PROBIOTIC ACIDOPHILUS PO) Take 1 tablet by mouth daily.      . calcium carbonate (TUMS - DOSED IN MG ELEMENTAL CALCIUM) 500 MG chewable tablet Chew 1 tablet by mouth as needed for indigestion or heartburn.    Marland Kitchen HYDROcodone-acetaminophen (NORCO) 5-325 MG tablet Take 1-2 tablets by mouth every 4 (four) hours as needed for moderate pain. (Patient not taking: Reported on 08/12/2016) 30 tablet 0   No current facility-administered medications on file prior to visit.      Objective:  Objective  Physical Exam  Constitutional: She is oriented to person, place, and time. She appears well-developed and well-nourished.  HENT:  Head: Normocephalic and atraumatic.  Eyes: Conjunctivae and EOM are normal.  Neck: Normal range of motion. Neck supple. No JVD present. Carotid bruit is not present. No thyromegaly present.  Cardiovascular: Normal rate, regular rhythm and  normal heart sounds.   No murmur heard. Pulmonary/Chest: Effort normal and breath sounds normal. No respiratory distress. She has no wheezes. She has no rales. She exhibits no tenderness.  Musculoskeletal: She exhibits no edema.  Neurological: She is alert and oriented to person, place, and time.  Psychiatric: She has a normal mood and affect.  Nursing note and vitals reviewed.  BP (!) 142/88 (BP Location: Right Arm, Patient Position:  Sitting, Cuff Size: Normal)   Pulse 79   Temp 98.4 F (36.9 C) (Oral)   Resp 16   Ht 5\' 5"  (1.651 m)   Wt 162 lb 12.8 oz (73.8 kg)   LMP 07/02/2016 (Approximate)   SpO2 98%   BMI 27.09 kg/m  Wt Readings from Last 3 Encounters:  08/12/16 162 lb 12.8 oz (73.8 kg)  07/12/16 163 lb (73.9 kg)  07/01/16 161 lb (73 kg)     Lab Results  Component Value Date   WBC 7.6 07/12/2016   HGB 14.4 07/12/2016   HCT 41.5 07/12/2016   PLT 303 07/12/2016   GLUCOSE 80 07/12/2016   CHOL 192 08/14/2012   TRIG 111 08/14/2012   HDL 57 08/14/2012   LDLCALC 113 (H) 08/14/2012   ALT 18 02/04/2016   AST 15 02/04/2016   NA 138 07/12/2016   K 3.9 07/12/2016   CL 107 07/12/2016   CREATININE 0.79 07/12/2016   BUN 12 07/12/2016   CO2 22 07/12/2016   TSH 0.76 06/03/2016   INR 1.00 07/12/2016    US Venous Img Lower Unilateral Right  Result Date: 07/14/2016 CLINICAL DATA:  Intermittent right calf pain for 3 years. EXAM: RIGHT LOWER EXTREMITY VENOUS DOPPLER ULTRASOUND TECHNIQUE: Gray-scale sonography with graded compression, as well as color Doppler and duplex ultrasound, were performed to evaluate the deep venous system from the level of the common femoral vein through the popliteal and proximal calf veins. Spectral Doppler was utilized to evaluate flow at rest and with distal augmentation maneuvers. COMPARISON:  None. FINDINGS: Left common femoral vein is patent without thrombus. Normal compressibility, augmentation and color Doppler flow in the right common femoral vein, right femoral vein and right popliteal vein. The right saphenofemoral junction is patent. Right profunda femoral vein is patent without thrombus. Visualized right deep calf veins are patent without thrombus. Right great saphenous vein is compressible and patent. IMPRESSION: Negative for deep venous thrombosis in right lower extremity. Electronically Signed   By: Jacqueline Calhoun M.D.   On: 07/14/2016 12:14     Assessment & Plan:  Plan  I have  discontinued [name]'s cephALEXin. I am also having her start on ciprofloxacin. Additionally, I am having her maintain her Probiotic Product (PROBIOTIC ACIDOPHILUS PO), multivitamin, B-complex with vitamin C, Vitamin D, LO LOESTRIN FE, Ibuprofen, acetaminophen, calcium carbonate, HYDROcodone-acetaminophen, ALPRAZolam, and losartan.  Meds ordered this encounter  Medications  . ciprofloxacin (CIPRO) 250 MG tablet    Sig: Take 1 tablet (250 mg total) by mouth 2 (two) times daily.    Dispense:  6 tablet    Refill:  0  . losartan (COZAAR) 100 MG tablet    Sig: TAKE 1 TABLET(100 MG) BY MOUTH DAILY    Dispense:  30 tablet    Refill:  0    Problem List Items Addressed This Visit    None    Visit Diagnoses    Left flank pain    -  Primary   Relevant Medications   ciprofloxacin (CIPRO) 250 MG tablet   Other Relevant Orders  POCT Urinalysis Dipstick (Automated) (Completed)   Hematuria, unspecified type       Relevant Medications   ciprofloxacin (CIPRO) 250 MG tablet   Other Relevant Orders   Urine culture (Completed)   Essential hypertension       Relevant Medications   losartan (COZAAR) 100 MG tablet    strain urine,  Inc po fluids Follow-up: Return in about 4 weeks (around 09/09/2016), or if symptoms worsen or fail to improve, for hypertension.  Ann Held, DO

## 2016-08-12 NOTE — Progress Notes (Signed)
Pre visit review using our clinic review tool, if applicable. No additional management support is needed unless otherwise documented below in the visit note. 

## 2016-08-13 LAB — URINE CULTURE: ORGANISM ID, BACTERIA: NO GROWTH

## 2016-08-15 MED ORDER — LOSARTAN POTASSIUM 100 MG PO TABS: ORAL_TABLET | ORAL | 0 refills | Status: DC

## 2016-09-10 ENCOUNTER — Encounter: Payer: Self-pay | Admitting: Family Medicine

## 2016-09-10 ENCOUNTER — Ambulatory Visit (INDEPENDENT_AMBULATORY_CARE_PROVIDER_SITE_OTHER): Payer: BC Managed Care – PPO | Admitting: Family Medicine

## 2016-09-10 ENCOUNTER — Other Ambulatory Visit (HOSPITAL_COMMUNITY)
Admission: RE | Admit: 2016-09-10 | Discharge: 2016-09-10 | Disposition: A | Discharge status: Home / Self Care | Payer: BC Managed Care – PPO | Source: Ambulatory Visit | Attending: Family Medicine | Admitting: Family Medicine

## 2016-09-10 VITALS — BP 136/80 | HR 76 | Temp 99.2°F | Resp 16 | Ht 64.6 in | Wt 162.4 lb

## 2016-09-10 DIAGNOSIS — R102 Pelvic and perineal pain: Secondary | ICD-10-CM

## 2016-09-10 DIAGNOSIS — I10 Essential (primary) hypertension: Secondary | ICD-10-CM | POA: Diagnosis not present

## 2016-09-10 DIAGNOSIS — R109 Unspecified abdominal pain: Secondary | ICD-10-CM | POA: Insufficient documentation

## 2016-09-10 LAB — POC URINALSYSI DIPSTICK (AUTOMATED)
BILIRUBIN UA: NEGATIVE
GLUCOSE UA: NEGATIVE
KETONES UA: NEGATIVE
Nitrite, UA: NEGATIVE
Protein, UA: NEGATIVE
RBC UA: NEGATIVE
Urobilinogen, UA: NEGATIVE
pH, UA: 6

## 2016-09-10 MED ORDER — LOSARTAN POTASSIUM 100 MG PO TABS: ORAL_TABLET | ORAL | 1 refills | Status: DC

## 2016-09-10 NOTE — Patient Instructions (Signed)
Flank Pain Flank pain refers to pain that is located on the side of the body between the upper abdomen and the back. The pain may occur over a short period of time (acute) or may be long-term or reoccurring (chronic). It may be mild or severe. Flank pain can be caused by many things. CAUSES  Some of the more common causes of flank pain include:  Muscle strains.   Muscle spasms.   A disease of your spine (vertebral disk disease).   A lung infection (pneumonia).   Fluid around your lungs (pulmonary edema).   A kidney infection.   Kidney stones.   A very painful skin rash caused by the chickenpox virus (shingles).   Gallbladder disease.  HOME CARE INSTRUCTIONS  Home care will depend on the cause of your pain. In general,  Rest as directed by your caregiver.  Drink enough fluids to keep your urine clear or pale yellow.  Only take over-the-counter or prescription medicines as directed by your caregiver. Some medicines may help relieve the pain.  Tell your caregiver about any changes in your pain.  Follow up with your caregiver as directed. SEEK IMMEDIATE MEDICAL CARE IF:   Your pain is not controlled with medicine.   You have new or worsening symptoms.  Your pain increases.   You have abdominal pain.   You have shortness of breath.   You have persistent nausea or vomiting.   You have swelling in your abdomen.   You feel faint or pass out.   You have blood in your urine.  You have a fever or persistent symptoms for more than 2-3 days.  You have a fever and your symptoms suddenly get worse. MAKE SURE YOU:   Understand these instructions.  Will watch your condition.  Will get help right away if you are not doing well or get worse. This information is not intended to replace advice given to you by your health care provider. Make sure you discuss any questions you have with your health care provider. Document Released: 10/07/2005 Document  Revised: 05/10/2012 Document Reviewed: 05/20/2015 Elsevier Interactive Patient Education  2017 [employer]  

## 2016-09-10 NOTE — Assessment & Plan Note (Signed)
Stable con't losartan 

## 2016-09-10 NOTE — Progress Notes (Signed)
Patient ID: Jacqueline Calhoun, female    DOB: Jan 01, 1976  Age: 41 y.o. MRN: [id number]    Subjective:  Subjective  HPI [name] presents for f/u bp and L flank pain. Pain improved with cipro and few days later period started and pain subsided but came back and she is expecting period any day now.   Pt not sure if she should see her gyn or urologist.  Review of Systems  Constitutional: Negative for activity change, appetite change, fatigue and unexpected weight change.  Respiratory: Negative for cough and shortness of breath.   Cardiovascular: Negative for chest pain and palpitations.  Gastrointestinal: Positive for abdominal pain. Negative for diarrhea, nausea and rectal pain.  Psychiatric/Behavioral: Negative for behavioral problems and dysphoric mood. The patient is not nervous/anxious.     History Past Medical History:  Diagnosis Date  . Anxiety   . Bladder tumor   . Depression   . Hashimoto's thyroiditis    dx 01/ 2016-- endocrinologist-  dr Cruzita Lederer  . History of ovarian cyst   . Hypertension   . IBS (irritable bowel syndrome)    eat gluten free and take probiotic  . Wears glasses     She has a past surgical history that includes No past surgeries and Transurethral resection of bladder tumor (N/A, 07/12/2016).   Her family history includes Alzheimer's disease in her paternal grandmother; Anxiety disorder in her father; Cancer in her maternal aunt and paternal aunt; Cancer (age of onset: 74) in her paternal grandfather; Heart disease (age of onset: 52) in her maternal grandfather and maternal grandmother; Hypertension in her father and mother; Thyroid disease in her paternal aunt and paternal grandmother.She reports that she has never smoked. She has never used smokeless tobacco. She reports that she drinks alcohol. She reports that she does not use drugs.  Current Outpatient Prescriptions on File Prior to Visit  Medication Sig Dispense Refill  . ALPRAZolam (XANAX) 0.5 MG tablet  Take 1 tablet (0.5 mg total) by mouth 3 (three) times daily as needed for anxiety. 21 tablet 0  . B Complex-C (B-COMPLEX WITH VITAMIN C) tablet Take 1 tablet by mouth daily.    . Cholecalciferol (VITAMIN D) 2000 UNITS CAPS Take 1 capsule by mouth daily.    . Ibuprofen (ADVIL) 200 MG CAPS Take by mouth as needed.    . LO LOESTRIN FE 1 MG-10 MCG / 10 MCG tablet Take 1 tablet by mouth every evening.   4  . Multiple Vitamin (MULTIVITAMIN) tablet Take 1 tablet by mouth daily.      . Probiotic Product (PROBIOTIC ACIDOPHILUS PO) Take 1 tablet by mouth daily.       No current facility-administered medications on file prior to visit.      Objective:  Objective  Physical Exam  Constitutional: She is oriented to person, place, and time. She appears well-developed and well-nourished.  HENT:  Head: Normocephalic and atraumatic.  Eyes: Conjunctivae and EOM are normal.  Neck: Normal range of motion. Neck supple. No JVD present. Carotid bruit is not present. No thyromegaly present.  Cardiovascular: Normal rate, regular rhythm and normal heart sounds.   No murmur heard. Pulmonary/Chest: Effort normal and breath sounds normal. No respiratory distress. She has no wheezes. She has no rales. She exhibits no tenderness.  Abdominal: Soft. She exhibits no distension and no mass. There is no tenderness. There is no rebound, no guarding and no CVA tenderness.  Genitourinary: Pelvic exam was performed with patient supine. There is no rash, tenderness,  lesion or injury on the right labia. There is no rash, tenderness, lesion or injury on the left labia. No erythema or tenderness in the vagina. No vaginal discharge found.  Musculoskeletal: She exhibits no edema.  Neurological: She is alert and oriented to person, place, and time.  Psychiatric: She has a normal mood and affect. Her behavior is normal. Judgment and thought content normal.  Nursing note and vitals reviewed.  BP 136/80 (BP Location: Left Arm, Cuff Size:  Normal)   Pulse 76   Temp 99.2 F (37.3 C) (Oral)   Resp 16   Ht 5' 4.6" (1.641 m)   Wt 162 lb 6.4 oz (73.7 kg)   LMP 09/02/2016   SpO2 98%   BMI 27.36 kg/m  Wt Readings from Last 3 Encounters:  09/10/16 162 lb 6.4 oz (73.7 kg)  08/12/16 162 lb 12.8 oz (73.8 kg)  07/12/16 163 lb (73.9 kg)     Lab Results  Component Value Date   WBC 7.6 07/12/2016   HGB 14.4 07/12/2016   HCT 41.5 07/12/2016   PLT 303 07/12/2016   GLUCOSE 80 07/12/2016   CHOL 192 08/14/2012   TRIG 111 08/14/2012   HDL 57 08/14/2012   LDLCALC 113 (H) 08/14/2012   ALT 18 02/04/2016   AST 15 02/04/2016   NA 138 07/12/2016   K 3.9 07/12/2016   CL 107 07/12/2016   CREATININE 0.79 07/12/2016   BUN 12 07/12/2016   CO2 22 07/12/2016   TSH 0.76 06/03/2016   INR 1.00 07/12/2016    US Venous Img Lower Unilateral Right  Result Date: 07/14/2016 CLINICAL DATA:  Intermittent right calf pain for 3 years. EXAM: RIGHT LOWER EXTREMITY VENOUS DOPPLER ULTRASOUND TECHNIQUE: Gray-scale sonography with graded compression, as well as color Doppler and duplex ultrasound, were performed to evaluate the deep venous system from the level of the common femoral vein through the popliteal and proximal calf veins. Spectral Doppler was utilized to evaluate flow at rest and with distal augmentation maneuvers. COMPARISON:  None. FINDINGS: Left common femoral vein is patent without thrombus. Normal compressibility, augmentation and color Doppler flow in the right common femoral vein, right femoral vein and right popliteal vein. The right saphenofemoral junction is patent. Right profunda femoral vein is patent without thrombus. Visualized right deep calf veins are patent without thrombus. Right great saphenous vein is compressible and patent. IMPRESSION: Negative for deep venous thrombosis in right lower extremity. Electronically Signed   By: Markus Daft M.D.   On: 07/14/2016 12:14     Assessment & Plan:  Plan  I have discontinued Ms.  Rencher's acetaminophen, calcium carbonate, HYDROcodone-acetaminophen, and ciprofloxacin. I am also having her maintain her Probiotic Product (PROBIOTIC ACIDOPHILUS PO), multivitamin, B-complex with vitamin C, Vitamin D, LO LOESTRIN FE, Ibuprofen, ALPRAZolam, fluticasone, OVER THE COUNTER MEDICATION, and losartan.  Meds ordered this encounter  Medications  . fluticasone (FLONASE) 50 MCG/ACT nasal spray    Sig: Place 2 sprays into both nostrils daily.  Marland Kitchen OVER THE COUNTER MEDICATION    Sig: 1 tablet as needed. GABA supplement  . losartan (COZAAR) 100 MG tablet    Sig: TAKE 1 TABLET(100 MG) BY MOUTH DAILY    Dispense:  90 tablet    Refill:  1    Problem List Items Addressed This Visit      Unprioritized   Hypertension    Stable con't losartan      Relevant Medications   losartan (COZAAR) 100 MG tablet    Other Visit  Diagnoses    Left flank pain    -  Primary   Relevant Orders   POCT Urinalysis Dipstick (Automated) (Completed)   Urine culture   Urine cytology ancillary only   Pelvic pain       Relevant Orders   Ambulatory referral to Gynecology   Urine culture   Urine cytology ancillary only    offered [place] but it can be done at gyn office as well and may be less expensive for pt She prefers to wait to see gyn unless its going to be a long waitn   Follow-up: Return if symptoms worsen or fail to improve.  Ann Held, DO

## 2016-09-10 NOTE — Progress Notes (Signed)
srPre visit review using our clinic review tool, if applicable. No additional management support is needed unless otherwise documented below in the visit note.

## 2016-09-13 LAB — URINE CYTOLOGY ANCILLARY ONLY
Chlamydia: NEGATIVE
Neisseria Gonorrhea: NEGATIVE
TRICH (WINDOWPATH): NEGATIVE

## 2016-09-16 LAB — URINE CYTOLOGY ANCILLARY ONLY
BACTERIAL VAGINITIS: NEGATIVE
CANDIDA VAGINITIS: NEGATIVE

## 2016-10-04 ENCOUNTER — Ambulatory Visit (INDEPENDENT_AMBULATORY_CARE_PROVIDER_SITE_OTHER): Payer: BC Managed Care – PPO | Admitting: Medical

## 2016-10-04 ENCOUNTER — Encounter: Payer: Self-pay | Admitting: Medical

## 2016-10-04 ENCOUNTER — Ambulatory Visit (HOSPITAL_BASED_OUTPATIENT_CLINIC_OR_DEPARTMENT_OTHER)
Admission: RE | Admit: 2016-10-04 | Discharge: 2016-10-04 | Disposition: A | Discharge status: Home / Self Care | Payer: BC Managed Care – PPO | Source: Ambulatory Visit | Attending: Medical | Admitting: Medical

## 2016-10-04 ENCOUNTER — Telehealth: Payer: Self-pay | Admitting: Family Medicine

## 2016-10-04 VITALS — BP 145/85 | HR 87 | Temp 98.3°F | Resp 16 | Ht 65.0 in | Wt 164.0 lb

## 2016-10-04 DIAGNOSIS — R0602 Shortness of breath: Secondary | ICD-10-CM

## 2016-10-04 DIAGNOSIS — R5383 Other fatigue: Secondary | ICD-10-CM | POA: Diagnosis not present

## 2016-10-04 DIAGNOSIS — F419 Anxiety disorder, unspecified: Secondary | ICD-10-CM

## 2016-10-04 DIAGNOSIS — R002 Palpitations: Secondary | ICD-10-CM

## 2016-10-04 DIAGNOSIS — R0789 Other chest pain: Secondary | ICD-10-CM

## 2016-10-04 DIAGNOSIS — R062 Wheezing: Secondary | ICD-10-CM

## 2016-10-04 DIAGNOSIS — R000 Tachycardia, unspecified: Secondary | ICD-10-CM

## 2016-10-04 DIAGNOSIS — J3489 Other specified disorders of nose and nasal sinuses: Secondary | ICD-10-CM

## 2016-10-04 LAB — COMPLETE METABOLIC PANEL WITH GFR
ALBUMIN: 4.1 g/dL (ref 3.6–5.1)
ALT: 22 U/L (ref 6–29)
AST: 18 U/L (ref 10–30)
Alkaline Phosphatase: 48 U/L (ref 33–115)
BUN: 15 mg/dL (ref 7–25)
CHLORIDE: 105 mmol/L (ref 98–110)
CO2: 26 mmol/L (ref 20–31)
Calcium: 9 mg/dL (ref 8.6–10.2)
Creat: 0.78 mg/dL (ref 0.50–1.10)
GFR, Est African American: >89 mL/min (ref >=60)
GLUCOSE: 88 mg/dL (ref 65–99)
POTASSIUM: 4.1 mmol/L (ref 3.5–5.3)
SODIUM: 138 mmol/L (ref 135–146)
Total Bilirubin: 0.3 mg/dL (ref 0.2–1.2)
Total Protein: 6.5 g/dL (ref 6.1–8.1)

## 2016-10-04 LAB — CBC WITH DIFFERENTIAL/PLATELET
Basophils Absolute: 100 cells/uL (ref 0–200)
Basophils Relative: 1 %
EOS ABS: 100 {cells}/uL (ref 15–500)
Eosinophils Relative: 1 %
HEMATOCRIT: 42.4 % (ref 35.0–45.0)
Hemoglobin: 14.4 g/dL (ref 11.7–15.5)
LYMPHS PCT: 28 %
Lymphs Abs: 2800 cells/uL (ref 850–3900)
MCH: 29.6 pg (ref 27.0–33.0)
MCHC: 34 g/dL (ref 32.0–36.0)
MCV: 87.1 fL (ref 80.0–100.0)
MONO ABS: 900 {cells}/uL (ref 200–950)
MONOS PCT: 9 %
MPV: 9.2 fL (ref 7.5–12.5)
Neutro Abs: 6100 cells/uL (ref 1500–7800)
Neutrophils Relative %: 61 %
Platelets: 358 10*3/uL (ref 140–400)
RBC: 4.87 MIL/uL (ref 3.80–5.10)
RDW: 12.8 % (ref 11.0–15.0)
WBC: 10 10*3/uL (ref 3.8–10.8)

## 2016-10-04 LAB — TROPONIN I: Troponin I: <0.01 ng/mL (ref <=0.05)

## 2016-10-04 MED ORDER — AZELASTINE HCL 0.1 % NA SOLN: 2.0000 | Freq: Two times a day (BID) | NASAL | 3 refills | Status: DC

## 2016-10-04 MED ORDER — AZITHROMYCIN 250 MG PO TABS: ORAL_TABLET | ORAL | 0 refills | Status: DC

## 2016-10-04 MED ORDER — ALPRAZOLAM 0.5 MG PO TABS: 0.5000 mg | ORAL_TABLET | Freq: Three times a day (TID) | ORAL | 0 refills | Status: DC | PRN

## 2016-10-04 MED ORDER — ALBUTEROL SULFATE HFA 108 (90 BASE) MCG/ACT IN AERS: 2.0000 | INHALATION_SPRAY | Freq: Four times a day (QID) | RESPIRATORY_TRACT | 0 refills | Status: DC | PRN

## 2016-10-04 NOTE — Telephone Encounter (Signed)
Patient Name: Jacqueline Calhoun  DOB: Jan 12, 1976    Initial Comment Caller states c/o dull and achy chest pain, feels like stabbing pain at times and difficulty catching her breath.   Nurse Assessment  Nurse: Raphael Gibney, RN, Vanita Ingles Date/Time (Eastern Time): 10/04/2016 8:19:13 AM  Confirm and document reason for call. If symptomatic, describe symptoms. ---Caller states she was diagnosed with the flu on Jan 18. She has a dull chest pain and is having difficulty breathing. Pain is sharp at times. No fever.  Does the patient have any new or worsening symptoms? ---Yes  Will a triage be completed? ---Yes  Related visit to physician within the last 2 weeks? ---No  Does the PT have any chronic conditions? (i.e. diabetes, asthma, etc.) ---No  Is the patient pregnant or possibly pregnant? (Ask all females between the ages of 72-55) ---No  Is this a behavioral health or substance abuse call? ---No     Guidelines    Guideline Title Affirmed Question Affirmed Notes  Chest Pain Difficulty breathing    Final Disposition User   Go to ED Now Raphael Gibney, RN, Vanita Ingles    Comments  Pt does not want to go to the ER but wants to come to the office.  called office and spoke to Santiago Glad and gave report that pt has been having chest pain which is an ache now but has been sharp at times. Having some difficulty breathing. Triage outcome go to ER now but pt does not want to go to ER but wants to come to the office.   Referrals  GO TO FACILITY REFUSED   Disagree/Comply: Disagree  Disagree/Comply Reason: Disagree with instructions

## 2016-10-04 NOTE — Patient Instructions (Addendum)
For your fatigue post likely flu will get cbc, cmp, and chest xray.   For atypical chest pain you ekg was normal and will get heart protein study.  For anxiety will refill your xanax.  For faint sinus pressure and congestion will rx astelin and continue flonase. If sinus pressure ethmoid sinus continue then azithromycin.  For occasional sob will get cxr. Also make albuterol available.  Follow up in 7 days or as needed

## 2016-10-04 NOTE — Telephone Encounter (Signed)
Patient called to get an appointment due to cold symptoms and chest pain. When asked about the type of chest pain the patient is having she stated that it is dull and achy pain, sometimes stabbing and it started yesterday 10/03/16. Patient also stated that she is having a hard time catching her breath. She states that she has not been coughing a lot though. Sent to [facility] to be triaged by a nurse before scheduling.

## 2016-10-04 NOTE — Progress Notes (Signed)
Pre visit review using our clinic review tool, if applicable. No additional management support is needed unless otherwise documented below in the visit note/SLS  

## 2016-10-04 NOTE — Progress Notes (Signed)
Subjective:    Patient ID: Jacqueline Calhoun, female    DOB: Jan 16, 1976, 41 y.o.   MRN: [id number]  HPI  Pt in states 2 weeks ago had fatigue, chills and fever with body aches all over. Pt states she did feel some congestion nasaly. She has some maxillary sinus region pain. Pt never had a cough. She thought she had flu symptoms.(pt did not have flu vaccine this year). Sinus pressure persists.  Pt has some mild sob recently also some ocassioanal transient random chest pain with heart racing. Theses symptoms going on since Saturday. Pt states some of her pain did responds to tylenol. She describes various brief different location of jabbing sensation. Last for a second and then goes away. Feel need to stretch a lot.  Pt lmp cycle was one week ago.  No leg pain.  Pt saw her therapist last week and she had walking pneumonia. Pt never smoked.  Rare use inhaler in past.   Some mild ethmoid sinus pressure. Faint congestion.  Pt has some anxiety intermittently. Pt states has some xanax left to [place] intermittently.  Return to work on October 07, 2016.  Review of Systems  Constitutional: Positive for fatigue.  HENT: Negative for congestion, ear pain, facial swelling, sinus pain, sinus pressure and sneezing.   Respiratory: Negative for cough, chest tightness, shortness of breath and wheezing.        No wheezing or sob but states at times feel like she needs to yawn a lot.  Cardiovascular: Positive for chest pain. Negative for palpitations.       Atypical transient sharp pain anterior chest.  Gastrointestinal: Negative for abdominal pain.  Musculoskeletal: Negative for back pain, gait problem and neck pain.  Neurological: Negative for dizziness and headaches.  Hematological: Negative for adenopathy. Does not bruise/bleed easily.  Psychiatric/Behavioral: Negative for behavioral problems and confusion.    Past Medical History:  Diagnosis Date  . Anxiety   . Bladder tumor   . Depression   .  Hashimoto's thyroiditis    dx 01/ 2016-- endocrinologist-  dr Cruzita Lederer  . History of ovarian cyst   . Hypertension   . IBS (irritable bowel syndrome)    eat gluten free and take probiotic  . Wears glasses      Social History   Social History  . Marital status: Married    Spouse name: N/A  . Number of children: N/A  . Years of education: N/A   Occupational History  . Not on file.   Social History Main Topics  . Smoking status: Never Smoker  . Smokeless tobacco: Never Used  . Alcohol use Yes     Comment: occasional  . Drug use: No  . Sexual activity: Yes    Birth control/ protection: Pill   Other Topics Concern  . Not on file   Social History Narrative  . No narrative on file    Past Surgical History:  Procedure Laterality Date  . NO PAST SURGERIES    . TRANSURETHRAL RESECTION OF BLADDER TUMOR N/A 07/12/2016   Procedure: TRANSURETHRAL RESECTION OF BLADDER TUMOR (TURBT);  Surgeon: Nickie Retort, MD;  Location: [facility];  Service: Urology;  Laterality: N/A;    Family History  Problem Relation Age of Onset  . Hypertension Mother   . Hypertension Father   . Anxiety disorder Father   . Heart disease Maternal Grandmother 67    MI  . Heart disease Maternal Grandfather 60    MI  .  Thyroid disease Paternal Aunt   . Cancer Paternal Aunt     breast  . Cancer Maternal Aunt     colon  . Alzheimer's disease Paternal Grandmother   . Thyroid disease Paternal Grandmother   . Cancer Paternal Grandfather 44    No Known Allergies  Current Outpatient Prescriptions on File Prior to Visit  Medication Sig Dispense Refill  . ALPRAZolam (XANAX) 0.5 MG tablet Take 1 tablet (0.5 mg total) by mouth 3 (three) times daily as needed for anxiety. 21 tablet 0  . B Complex-C (B-COMPLEX WITH VITAMIN C) tablet Take 1 tablet by mouth daily.    . Cholecalciferol (VITAMIN D) 2000 UNITS CAPS Take 1 capsule by mouth daily.    . fluticasone (FLONASE) 50 MCG/ACT nasal  spray Place 2 sprays into both nostrils daily.    . Ibuprofen (ADVIL) 200 MG CAPS Take by mouth as needed.    . LO LOESTRIN FE 1 MG-10 MCG / 10 MCG tablet Take 1 tablet by mouth every evening.   4  . losartan (COZAAR) 100 MG tablet TAKE 1 TABLET(100 MG) BY MOUTH DAILY 90 tablet 1  . Multiple Vitamin (MULTIVITAMIN) tablet Take 1 tablet by mouth daily.      Marland Kitchen OVER THE COUNTER MEDICATION 1 tablet as needed. GABA supplement    . Probiotic Product (PROBIOTIC ACIDOPHILUS PO) Take 1 tablet by mouth daily.       No current facility-administered medications on file prior to visit.     BP (!) 159/99 (BP Location: Right Arm, Patient Position: Sitting, Cuff Size: Large)   Pulse 87   Temp 98.3 F (36.8 C) (Oral)   Resp 16   Ht 5\' 5"  (1.651 m)   Wt 164 lb (74.4 kg)   LMP 09/29/2016   SpO2 100%   BMI 27.29 kg/m       Objective:   Physical Exam  General  Mental Status - Alert. General Appearance - Well groomed. Not in acute distress.  Skin Rashes- No Rashes.  HEENT Head- Normal. Ear Auditory Canal - Left- Normal. Right - Normal.Tympanic Membrane- Left- Normal. Right- Normal. Eye Sclera/Conjunctiva- Left- Normal. Right- Normal. Nose & Sinuses Nasal Mucosa- Left-  Boggy and Congested. Right-  Boggy and  Congested.Bilateral maxillary and frontal sinus pressure. Mouth & Throat Lips: Upper Lip- Normal: no dryness, cracking, pallor, cyanosis, or vesicular eruption. Lower Lip-Normal: no dryness, cracking, pallor, cyanosis or vesicular eruption. Buccal Mucosa- Bilateral- No Aphthous ulcers. Oropharynx- No Discharge or Erythema. Tonsils: Characteristics- Bilateral- No Erythema or Congestion. Size/Enlargement- Bilateral- No enlargement. Discharge- bilateral-None.  Neck Neck- Supple. No Masses.   Chest and Lung Exam Auscultation: Breath Sounds:-Clear even and unlabored.  Cardiovascular Auscultation:Rythm- Regular, rate and rhythm. Murmurs & Other Heart Sounds:Ausculatation of the heart  reveal- No Murmurs.  Lymphatic Head & Neck General Head & Neck Lymphatics: Bilateral: Description- No Localized lymphadenopathy.       Assessment & Plan:  ekg showed nsr.  For your fatigue post likely flu will get cbc, cmp, and chest xray.   For atypical chest pain you ekg was normal and will get heart protein study.  For anxiety will refill your xanax.  For faint sinus pressure and congestion will rx astelin and continue flonase. If sinus pressure ethmoid sinus continue then azithromycin.  For occasional sob will get cxr. Also make albuterol available.  Will follow pt cardiac protein studies. When cardiac protein studies are back plan to discuss with pt that could refer to cardiologist. Since she has some  subjective heart racing and chest discomfort.  Follow up 7 days or as needed  Gabbriella Presswood, Percell Miller, [employer]

## 2016-10-04 NOTE — Telephone Encounter (Signed)
Received call from [facility] this am regarding patients refusal to go to ED with complaints of chest pain and shortness of breath. Called patient who states she had the flu 2 weeks ago and continues to have fatigue and headaches. Patient states she developed chest pain and difficulty breathing over the weekend. Advised patient with symptoms she has voiced, provider would more than likely send her to ED for evaluation. Patient continued to refuse to go directly to ED for evaluation. Appointment scheduled for today with E. Saguier, PA-C. No openings with her provider. Advised patient to go directly to ED or UC if symptoms worsened. Patient agreed.

## 2016-10-05 NOTE — Telephone Encounter (Signed)
Future lipid panel placed. 

## 2016-10-05 NOTE — Telephone Encounter (Signed)
Referred pt to cardiologist

## 2016-10-08 NOTE — Telephone Encounter (Signed)
I talked with Dr. Etter Sjogren. She thought way we could proceed would be event monitor 1st. Will review that when results in. Then decide if further work up needed. Please notify pt.(that is if she still having occasional palpitation, racing heart sensation or mild discomfort)? Do these still perist.  If pt declines or states all symptoms resolved please document.

## 2016-10-12 NOTE — Telephone Encounter (Signed)
Awaiting return call from patient

## 2016-10-13 ENCOUNTER — Ambulatory Visit: Payer: BC Managed Care – PPO | Admitting: Physician Assistant

## 2016-10-19 ENCOUNTER — Encounter: Payer: Self-pay | Admitting: *Deleted

## 2016-12-29 ENCOUNTER — Other Ambulatory Visit: Payer: Self-pay

## 2016-12-29 DIAGNOSIS — I10 Essential (primary) hypertension: Secondary | ICD-10-CM

## 2016-12-30 ENCOUNTER — Telehealth: Payer: Self-pay

## 2016-12-30 NOTE — Telephone Encounter (Signed)
The pharmacy sent a refill request for patient's birth control pills.  We do not yet have her paper chart. She does have CE scheduled 01/06/17 with Dr. Dellis Filbert.  I asked patient had she signed release and made arrangements for her chart to be sent here. She had not. She did say she called that office and they told her to tell her pharmacy to call here. I asked her to call and make arrangements to have her chart sent here and also to ask them if they will help her with her refill since they have her paper chart.

## 2017-01-06 ENCOUNTER — Ambulatory Visit (INDEPENDENT_AMBULATORY_CARE_PROVIDER_SITE_OTHER): Payer: BC Managed Care – PPO | Admitting: Obstetrics & Gynecology

## 2017-01-06 ENCOUNTER — Encounter: Payer: Self-pay | Admitting: Obstetrics & Gynecology

## 2017-01-06 VITALS — BP 120/82 | Ht 65.0 in | Wt 165.0 lb

## 2017-01-06 DIAGNOSIS — Z3041 Encounter for surveillance of contraceptive pills: Secondary | ICD-10-CM

## 2017-01-06 DIAGNOSIS — Z01419 Encounter for gynecological examination (general) (routine) without abnormal findings: Secondary | ICD-10-CM | POA: Diagnosis not present

## 2017-01-06 DIAGNOSIS — Z8551 Personal history of malignant neoplasm of bladder: Secondary | ICD-10-CM | POA: Diagnosis not present

## 2017-01-06 LAB — CBC
HEMATOCRIT: 43.7 % (ref 35.0–45.0)
Hemoglobin: 14.5 g/dL (ref 11.7–15.5)
MCH: 29.3 pg (ref 27.0–33.0)
MCHC: 33.2 g/dL (ref 32.0–36.0)
MCV: 88.3 fL (ref 80.0–100.0)
MPV: 9.7 fL (ref 7.5–12.5)
PLATELETS: 304 10*3/uL (ref 140–400)
RBC: 4.95 MIL/uL (ref 3.80–5.10)
RDW: 13.3 % (ref 11.0–15.0)
WBC: 7.6 10*3/uL (ref 3.8–10.8)

## 2017-01-06 LAB — COMPREHENSIVE METABOLIC PANEL
ALK PHOS: 47 U/L (ref 33–115)
ALT: 23 U/L (ref 6–29)
AST: 22 U/L (ref 10–30)
Albumin: 4 g/dL (ref 3.6–5.1)
BILIRUBIN TOTAL: 0.5 mg/dL (ref 0.2–1.2)
BUN: 12 mg/dL (ref 7–25)
CO2: 21 mmol/L (ref 20–31)
CREATININE: 0.93 mg/dL (ref 0.50–1.10)
Calcium: 9 mg/dL (ref 8.6–10.2)
Chloride: 105 mmol/L (ref 98–110)
GLUCOSE: 80 mg/dL (ref 65–99)
Potassium: 4.4 mmol/L (ref 3.5–5.3)
SODIUM: 137 mmol/L (ref 135–146)
Total Protein: 6.6 g/dL (ref 6.1–8.1)

## 2017-01-06 LAB — LIPID PANEL
Cholesterol: 167 mg/dL (ref <200)
HDL: 53 mg/dL (ref >50)
LDL CALC: 95 mg/dL (ref <100)
Total CHOL/HDL Ratio: 3.2 Ratio (ref <5.0)
Triglycerides: 94 mg/dL (ref <150)
VLDL: 19 mg/dL (ref <30)

## 2017-01-06 LAB — TSH: TSH: 0.9 mIU/L

## 2017-01-06 LAB — VITAMIN B12: VITAMIN B 12: 364 pg/mL (ref 200–1100)

## 2017-01-06 MED ORDER — LO LOESTRIN FE 1 MG-10 MCG / 10 MCG PO TABS: 1.0000 | ORAL_TABLET | Freq: Every evening | ORAL | 4 refills | Status: DC

## 2017-01-06 NOTE — Progress Notes (Signed)
Jacqueline Calhoun 08/05/1976 [id number]   History:    41 y.o. G1P1 married.  Both musicians.  Daughter 58 yo plays the violin.  RP:  Established patient presenting for annual gyn exam  Well on Lo-LoEstrin.  Occasional mid cycle mild BTB stable x last yr.  No pelvic pain.  No vaginal d/c.  Breasts wnl.  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  Mother deceased from PE last year.  Past medical history,surgical history, family history and social history were all reviewed and documented in the EPIC chart.  Gynecologic History Patient's last menstrual period was 12/21/2016. Contraception: OCP (estrogen/progesterone) Last Pap: 10/2015. Results were: normal/HPV HR neg Last mammogram: 10/2015. Results were: Dx/US Normal.  Obstetric History OB History  Gravida Para Term Preterm AB Living  1 1     0 1  SAB TAB Ectopic Multiple Live Births      0        # Outcome Date GA Lbr Len/2nd Weight Sex Delivery Anes PTL Lv  1 Para                ROS: A ROS was performed and pertinent positives and negatives are included in the history.  GENERAL: No fevers or chills. HEENT: No change in vision, no earache, sore throat or sinus congestion. NECK: No pain or stiffness. CARDIOVASCULAR: No chest pain or pressure. No palpitations. PULMONARY: No shortness of breath, cough or wheeze. GASTROINTESTINAL: No abdominal pain, nausea, vomiting or diarrhea, melena or bright red blood per rectum. GENITOURINARY: No urinary frequency, urgency, hesitancy or dysuria. MUSCULOSKELETAL: No joint or muscle pain, no back pain, no recent trauma. DERMATOLOGIC: No rash, no itching, no lesions. ENDOCRINE: No polyuria, polydipsia, no heat or cold intolerance. No recent change in weight. HEMATOLOGICAL: No anemia or easy bruising or bleeding. NEUROLOGIC: No headache, seizures, numbness, tingling or weakness. PSYCHIATRIC: No depression, no loss of interest in normal activity or change in sleep pattern.     Exam:   BP 120/82    Ht 5' 5" (1.651 m)   Wt 165 lb (74.8 kg)   LMP 12/21/2016   BMI 27.46 kg/m   Body mass index is 27.46 kg/m.  General appearance : Well developed well nourished female. No acute distress HEENT: Eyes: no retinal hemorrhage or exudates,  Neck supple, trachea midline, no carotid bruits, no thyroidmegaly Lungs: Clear to auscultation, no rhonchi or wheezes, or rib retractions  Heart: Regular rate and rhythm, no murmurs or gallops Breast:Examined in sitting and supine position were symmetrical in appearance, no palpable masses or tenderness,  no skin retraction, no nipple inversion, no nipple discharge, no skin discoloration, no axillary or supraclavicular lymphadenopathy Abdomen: no palpable masses or tenderness, no rebound or guarding Extremities: no edema or skin discoloration or tenderness  Pelvic:  Bartholin, Urethra, Skene Glands: Within normal limits             Vagina: No gross lesions or discharge  Cervix: No gross lesions or discharge.  Pap reflex done.  Uterus  AV, normal size, shape and consistency, non-tender and mobile  Adnexa  Without masses or tenderness  Anus and perineum  normal     Assessment/Plan:  41 y.o. female for annual exam   1. Encounter for routine gynecological examination with Papanicolaou smear of cervix Normal Gyn exam.  Pap reflex done.  Schedule screening Mammo. - CBC - Comp Met (CMET) - Lipid panel - TSH - Vitamin D 1,25 dihydroxy - Vitamin B12  2. Encounter  for surveillance of contraceptive pills Lo-LoEstrin represcribed.  Very mild occasional BTB mid cycle, recommended doubling pill for a couple of days as needed if persists.  Prefers to stay on very low Estradiol dosage given mother's death secondary to PE.  3. 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 - NO EVIDENCE OF INVASION. Has a follow-up visit with Urology coming up.  Princess Bruins MD, 10:29  AM 01/06/2017

## 2017-01-06 NOTE — Patient Instructions (Signed)
1. Encounter for routine gynecological examination with Papanicolaou smear of cervix Normal Gyn exam.  Pap reflex done.  Schedule screening Mammo. - CBC - Comp Met (CMET) - Lipid panel - TSH - Vitamin D 1,25 dihydroxy - Vitamin B12  2. Encounter for surveillance of contraceptive pills Lo-LoEstrin represcribed.  Very mild occasional BTB mid cycle, recommended doubling pill for a couple of days as needed if persists.  Prefers to stay on very low Estradiol dosage given mother's PE.  3. 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 - NO EVIDENCE OF INVASION. Has a follow-up visit with Urology coming up.  Deane, it was a pleasure to see you today!  I will inform you of all your results as soon as available.

## 2017-01-06 NOTE — Addendum Note (Signed)
Addended by: Burnett Kanaris on: 01/06/2017 10:59 AM   Modules accepted: Orders

## 2017-01-07 ENCOUNTER — Encounter: Payer: Self-pay | Admitting: Obstetrics & Gynecology

## 2017-01-07 LAB — PAP IG W/ RFLX HPV ASCU

## 2017-01-10 LAB — VITAMIN D 1,25 DIHYDROXY
VITAMIN D3 1, 25 (OH): 67 pg/mL
Vitamin D 1, 25 (OH)2 Total: 67 pg/mL (ref 18–72)

## 2017-03-11 ENCOUNTER — Other Ambulatory Visit: Payer: Self-pay | Admitting: Family Medicine

## 2017-03-11 DIAGNOSIS — I10 Essential (primary) hypertension: Secondary | ICD-10-CM

## 2017-06-06 ENCOUNTER — Other Ambulatory Visit: Payer: Self-pay | Admitting: Family Medicine

## 2017-06-06 DIAGNOSIS — I10 Essential (primary) hypertension: Secondary | ICD-10-CM

## 2017-07-04 ENCOUNTER — Ambulatory Visit: Payer: BC Managed Care – PPO | Admitting: Family Medicine

## 2017-07-04 ENCOUNTER — Encounter: Payer: Self-pay | Admitting: Family Medicine

## 2017-07-04 VITALS — BP 144/90 | Temp 98.2°F | Ht 65.0 in | Wt 166.0 lb

## 2017-07-04 DIAGNOSIS — R1013 Epigastric pain: Secondary | ICD-10-CM

## 2017-07-04 DIAGNOSIS — Z23 Encounter for immunization: Secondary | ICD-10-CM | POA: Diagnosis not present

## 2017-07-04 DIAGNOSIS — I10 Essential (primary) hypertension: Secondary | ICD-10-CM | POA: Diagnosis not present

## 2017-07-04 MED ORDER — RANITIDINE HCL 150 MG PO TABS: 150.0000 mg | ORAL_TABLET | Freq: Two times a day (BID) | ORAL | 2 refills | Status: DC

## 2017-07-04 MED ORDER — SPIRONOLACTONE 25 MG PO TABS: 25.0000 mg | ORAL_TABLET | Freq: Every day | ORAL | 2 refills | Status: DC

## 2017-07-04 MED ORDER — LOSARTAN POTASSIUM 100 MG PO TABS: ORAL_TABLET | ORAL | 1 refills | Status: DC

## 2017-07-04 NOTE — Progress Notes (Signed)
Patient ID: Jacqueline Calhoun, female    DOB: [birth year]  Age: 41 y.o. MRN: [id number]    Subjective:  Subjective  HPI Jacqueline Calhoun presents for bp f/u and c/o gerd ----  She also c/o fatigue    She is also c/o anxiety/ depression  Review of Systems  Constitutional: Positive for fatigue. Negative for activity change, appetite change, chills, diaphoresis, fever and unexpected weight change.  Eyes: Negative for pain, redness and visual disturbance.  Respiratory: Positive for cough. Negative for chest tightness, shortness of breath and wheezing.   Cardiovascular: Negative for chest pain, palpitations and leg swelling.  Gastrointestinal: Positive for abdominal distention. Negative for abdominal pain.  Endocrine: Negative for cold intolerance, heat intolerance, polydipsia, polyphagia and polyuria.  Genitourinary: Negative for difficulty urinating, dyspareunia, dysuria, flank pain, frequency, genital sores, hematuria, menstrual problem, pelvic pain, urgency, vaginal discharge and vaginal pain.  Musculoskeletal: Negative for back pain.  Neurological: Negative for dizziness, light-headedness, numbness and headaches.    History Past Medical History:  Diagnosis Date  . Anxiety   . Bladder tumor   . Depression   . Hashimoto's thyroiditis    dx 01/ 2016-- endocrinologist-  dr Cruzita Lederer  . History of ovarian cyst   . Hypertension   . IBS (irritable bowel syndrome)    eat gluten free and take probiotic  . Wears glasses     She has a past surgical history that includes No past surgeries and TRANSURETHRAL RESECTION OF BLADDER TUMOR (TURBT) (N/A, 07/12/2016).   Her family history includes Alzheimer's disease in her paternal grandmother; Anxiety disorder in her father; Cancer in her maternal aunt and paternal aunt; Cancer (age of onset: 83) in her paternal grandfather; Heart disease (age of onset: 54) in her maternal grandfather and maternal grandmother; Hypertension in her father and mother; Pulmonary  embolism in her mother; Thyroid disease in her paternal aunt and paternal grandmother.She reports that  has never smoked. she has never used smokeless tobacco. She reports that she drinks alcohol. She reports that she does not use drugs.  Current Outpatient Medications on File Prior to Visit  Medication Sig Dispense Refill  . ALPRAZolam (XANAX) 0.5 MG tablet Take 1 tablet (0.5 mg total) by mouth 3 (three) times daily as needed for anxiety. 21 tablet 0  . B Complex-C (B-COMPLEX WITH VITAMIN C) tablet Take 1 tablet by mouth daily.    . Cholecalciferol (VITAMIN D) 2000 UNITS CAPS Take 1 capsule by mouth daily.    . fluticasone (FLONASE) 50 MCG/ACT nasal spray Place 2 sprays into both nostrils daily.    . Ibuprofen (ADVIL) 200 MG CAPS Take by mouth as needed.    . LO LOESTRIN FE 1 MG-10 MCG / 10 MCG tablet Take 1 tablet by mouth every evening. 3 Package 4  . Multiple Vitamin (MULTIVITAMIN) tablet Take 1 tablet by mouth daily.      Marland Kitchen OVER THE COUNTER MEDICATION 1 tablet as needed. GABA supplement    . Probiotic Product (PROBIOTIC ACIDOPHILUS PO) Take 1 tablet by mouth daily.       No current facility-administered medications on file prior to visit.      Objective:  Objective  Physical Exam  Constitutional: She is oriented to person, place, and time. She appears well-developed and well-nourished.  HENT:  Head: Normocephalic and atraumatic.  Eyes: Conjunctivae and EOM are normal.  Neck: Normal range of motion. Neck supple. No JVD present. Carotid bruit is not present. No thyromegaly present.  Cardiovascular: Normal rate, regular rhythm and  normal heart sounds.  No murmur heard. Pulmonary/Chest: Effort normal and breath sounds normal. No respiratory distress. She has no wheezes. She has no rales. She exhibits no tenderness.  Musculoskeletal: She exhibits no edema.  Neurological: She is alert and oriented to person, place, and time.  Psychiatric: She has a normal mood and affect.  Nursing note  and vitals reviewed.  BP (!) 144/90   Temp 98.2 F (36.8 C) (Oral)   Ht 5\' 5"  (1.651 m)   Wt 166 lb (75.3 kg)   LMP 06/08/2017 (Approximate)   BMI 27.62 kg/m  Wt Readings from Last 3 Encounters:  07/04/17 166 lb (75.3 kg)  01/06/17 165 lb (74.8 kg)  10/04/16 164 lb (74.4 kg)     Lab Results  Component Value Date   WBC 7.6 01/06/2017   HGB 14.5 01/06/2017   HCT 43.7 01/06/2017   PLT 304 01/06/2017   GLUCOSE 80 01/06/2017   CHOL 167 01/06/2017   TRIG 94 01/06/2017   HDL 53 01/06/2017   LDLCALC 95 01/06/2017   ALT 23 01/06/2017   AST 22 01/06/2017   NA 137 01/06/2017   K 4.4 01/06/2017   CL 105 01/06/2017   CREATININE 0.93 01/06/2017   BUN 12 01/06/2017   CO2 21 01/06/2017   TSH 0.90 01/06/2017   INR 1.00 07/12/2016    Dg Chest 2 View  Result Date: 10/04/2016 CLINICAL DATA:  Wheezing with shortness of breath and fatigue. Fever. Chest pain. EXAM: CHEST  2 VIEW COMPARISON:  None. FINDINGS: The heart size and mediastinal contours are within normal limits. Both lungs are clear. The visualized skeletal structures are unremarkable. IMPRESSION: No active cardiopulmonary disease. Electronically Signed   By: Staci Righter M.D.   On: 10/04/2016 16:26     Assessment & Plan:  Plan  I am having Jacqueline Calhoun start on spironolactone and ranitidine. I am also having her maintain her Probiotic Product (PROBIOTIC ACIDOPHILUS PO), multivitamin, B-complex with vitamin C, Vitamin D, Ibuprofen, fluticasone, OVER THE COUNTER MEDICATION, ALPRAZolam, LO LOESTRIN FE, and losartan.  Meds ordered this encounter  Medications  . losartan (COZAAR) 100 MG tablet    Sig: TAKE 1 TABLET(100 MG) BY MOUTH DAILY    Dispense:  90 tablet    Refill:  1  . spironolactone (ALDACTONE) 25 MG tablet    Sig: Take 1 tablet (25 mg total) daily by mouth.    Dispense:  30 tablet    Refill:  2  . ranitidine (ZANTAC) 150 MG tablet    Sig: Take 1 tablet (150 mg total) 2 (two) times daily by mouth.    Dispense:   60 tablet    Refill:  2    Problem List Items Addressed This Visit      Unprioritized   Hypertension    .Poorly controlled will alter medications, encouraged DASH diet, minimize caffeine and obtain adequate sleep. Report concerning symptoms and follow up as directed and as needed      Relevant Medications   losartan (COZAAR) 100 MG tablet   spironolactone (ALDACTONE) 25 MG tablet    Other Visit Diagnoses    Need for immunization against influenza    -  Primary   Relevant Orders   Flu Vaccine QUAD 6+ mos IM (Fluarix) (Completed)   Dyspepsia       Relevant Medications   ranitidine (ZANTAC) 150 MG tablet      Follow-up: Return in about 3 weeks (around 07/25/2017), or if symptoms worsen or fail to improve,  for hypertension.  Ann Held, DO

## 2017-07-04 NOTE — Patient Instructions (Signed)
Food Choices for Gastroesophageal Reflux Disease, Adult When you have gastroesophageal reflux disease (GERD), the foods you eat and your eating habits are very important. Choosing the right foods can help ease your discomfort. What guidelines do I need to follow?  Choose fruits, vegetables, whole grains, and low-fat dairy products.  Choose low-fat meat, fish, and poultry.  Limit fats such as oils, salad dressings, butter, nuts, and avocado.  Keep a food diary. This helps you identify foods that cause symptoms.  Avoid foods that cause symptoms. These may be different for everyone.  Eat small meals often instead of 3 large meals a day.  Eat your meals slowly, in a place where you are relaxed.  Limit fried foods.  Cook foods using methods other than frying.  Avoid drinking alcohol.  Avoid drinking large amounts of liquids with your meals.  Avoid bending over or lying down until 2-3 hours after eating. What foods are not recommended? These are some foods and drinks that may make your symptoms worse: Vegetables  Tomatoes. Tomato juice. Tomato and spaghetti sauce. Chili peppers. Onion and garlic. Horseradish. Fruits  Oranges, grapefruit, and lemon (fruit and juice). Meats  High-fat meats, fish, and poultry. This includes hot dogs, ribs, ham, sausage, salami, and bacon. Dairy  Whole milk and chocolate milk. Sour cream. Cream. Butter. Ice cream. Cream cheese. Drinks  Coffee and tea. Bubbly (carbonated) drinks or energy drinks. Condiments  Hot sauce. Barbecue sauce. Sweets/Desserts  Chocolate and cocoa. Donuts. Peppermint and spearmint. Fats and Oils  High-fat foods. This includes French fries and potato chips. Other  Vinegar. Strong spices. This includes black pepper, white pepper, red pepper, cayenne, curry powder, cloves, ginger, and chili powder. The items listed above may not be a complete list of foods and drinks to avoid. Contact your dietitian for more information.    This information is not intended to replace advice given to you by your health care provider. Make sure you discuss any questions you have with your health care provider. Document Released: 02/15/2012 Document Revised: 01/22/2016 Document Reviewed: 06/20/2013 Elsevier Interactive Patient Education  2017 [employer]  

## 2017-07-04 NOTE — Assessment & Plan Note (Signed)
Poorly controlled will alter medications, encouraged DASH diet, minimize caffeine and obtain adequate sleep. Report concerning symptoms and follow up as directed and as needed 

## 2017-07-26 ENCOUNTER — Ambulatory Visit (INDEPENDENT_AMBULATORY_CARE_PROVIDER_SITE_OTHER): Payer: BC Managed Care – PPO | Admitting: Family Medicine

## 2017-07-26 VITALS — BP 142/84 | HR 76

## 2017-07-26 DIAGNOSIS — I10 Essential (primary) hypertension: Secondary | ICD-10-CM | POA: Diagnosis not present

## 2017-07-26 MED ORDER — IRBESARTAN 150 MG PO TABS: 150.0000 mg | ORAL_TABLET | Freq: Every day | ORAL | 1 refills | Status: DC

## 2017-07-26 NOTE — Progress Notes (Addendum)
Pre visit review using our clinic tool,if applicable. No additional management support is needed unless otherwise documented below in the visit note.   Patient in for BP check per order from Dr, Carollee Herter dated 07/04/2017  Patient states she has not has Spironolactone 25 mg this am but did take Losartan 100 mg last pm.  No complaints voiced this visit.  BP= 142/84 P= 76  Per Dr. Etter Sjogren patient will need to replace Losartan with Avapro 150 mg daily and contiue Spironolactone 25 mg daily.  Return for recheck of BP in 2 weeks. Appointment scheduled for 08/09/17 @9 :30 am   Ann Held, DO

## 2017-08-02 ENCOUNTER — Encounter: Payer: Self-pay | Admitting: Internal Medicine

## 2017-08-02 ENCOUNTER — Ambulatory Visit: Payer: BC Managed Care – PPO | Admitting: Internal Medicine

## 2017-08-02 VITALS — BP 150/100 | HR 77 | Wt 163.2 lb

## 2017-08-02 DIAGNOSIS — R5383 Other fatigue: Secondary | ICD-10-CM

## 2017-08-02 DIAGNOSIS — E063 Autoimmune thyroiditis: Secondary | ICD-10-CM | POA: Diagnosis not present

## 2017-08-02 LAB — T4, FREE: Free T4: 0.66 ng/dL (ref 0.60–1.60)

## 2017-08-02 LAB — T3, FREE: T3, Free: 4.1 pg/mL (ref 2.3–4.2)

## 2017-08-02 LAB — TSH: TSH: 1.05 u[IU]/mL (ref 0.35–4.50)

## 2017-08-02 NOTE — Patient Instructions (Addendum)
Please stop at the lab.  Please come back for a follow-up appointment in 1 year, but let's check labs in 6 more months.

## 2017-08-02 NOTE — Progress Notes (Addendum)
Patient ID: Jacqueline Calhoun, female   DOB: 1975-11-19, 41 y.o.   MRN: [id number]   HPI  Jacqueline Calhoun is a 41 y.o.-year-old female, returning for f/u for Hashimoto's thyroiditis. Last visit 1 year ago.  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 No extension, no mets.   She was also dx'ed with HTN. On Spironolactone and now added Avapro.   Reviewed and addended history: Pt. has been dx with Hashimoto's thyroiditis in 08/2014 during investigation for several complaints >> see below.  She described CP in 2012  >> anxiety, HTN >> started anxiety meds and BP meds, but sxs not better. She also had depression; last year also started to have night sweats, now better. She wakes up early in am, does not sleep well; also has dry skin. Menses: after her baby in 2007 >> menorrhagia >> saw ObGyn >> different images tests >> thick endometrium. In 2014 she had an ovarian cyst >> pain >> started OCPs.   We checked f her thyroid antibodies in 2016 and 2017 and her ATA antibodies have been positive.  Pt describes - + Fatigue - + more anxiety - + insomnia - + Hot flashes at night - + dry patches of skin  But otherwise: -No weight gain or weight loss -No heat intolerance or cold intolerance -No diarrhea or constipation -No hair loss  I reviewed pt's thyroid tests >> normal: Lab Results  Component Value Date   TSH 0.90 01/06/2017   TSH 0.76 06/03/2016   TSH 1.03 12/01/2015   TSH 0.79 04/14/2015   TSH 1.07 10/08/2014   TSH 1.353 10/30/2012   TSH 1.099 07/20/2011   FREET4 0.69 06/03/2016   FREET4 0.69 12/01/2015   FREET4 0.71 04/14/2015   FREET4 0.78 10/08/2014   FREET4 1.13 10/30/2012  04/23/2014 (ObGyn): TSH 0.93, fT4 0.96, fT3 3.2  ATA antibodies have been mildly positive: Component     Latest Ref Rng & Units 09/23/2014 04/14/2015 06/03/2016  Thyroperoxidase Ab SerPl-aCnc     <9 IU/mL 6 6   Thyroglobulin Ab     <2 IU/mL 5 (H) 3 (H) 3 (H)   Pt denies: - feeling  nodules in neck - hoarseness - dysphagia - choking - SOB with lying down  She also has a h/o HTN and elevated LFTs >> normalized after starting a gluten-free diet 3 years ago.  She continues on this diet.  On B vitamin >> stopped few days ago.  ROS: Constitutional: + Please see HPI Eyes: no blurry vision, no xerophthalmia ENT: no sore throat, + see HPI Cardiovascular: no CP/no SOB/no palpitations/no leg swelling Respiratory: no cough/no SOB/no wheezing Gastrointestinal: no N/no V/no D/no C/+ acid reflux Musculoskeletal: no muscle aches/no joint aches Skin: no rashes, no hair loss, + dry skin Neurological: no tremors/no numbness/no tingling/no dizziness, + headaches  I reviewed pt's medications, allergies, PMH, social hx, family hx, and changes were documented in the history of present illness. Otherwise, unchanged from my initial visit note.  She stopped losartan and started irbesartan.  Past Medical History:  Diagnosis Date  . Anxiety   . Bladder tumor   . Depression   . Hashimoto's thyroiditis    dx 01/ 2016-- endocrinologist-  dr Cruzita Lederer  . History of ovarian cyst   . Hypertension   . IBS (irritable bowel syndrome)    eat gluten free and take probiotic  . Wears glasses    Past Surgical History:  Procedure Laterality Date  . NO PAST SURGERIES    .  TRANSURETHRAL RESECTION OF BLADDER TUMOR N/A 07/12/2016   Procedure: TRANSURETHRAL RESECTION OF BLADDER TUMOR (TURBT);  Surgeon: Nickie Retort, MD;  Location: [facility];  Service: Urology;  Laterality: N/A;     History   Social History  . Marital Status: Married    Spouse Name: N/A    Number of Children: 1   Occupational History  . teacher   Social History Main Topics  . Smoking status: Never Smoker   . Smokeless tobacco: Never Used  . Alcohol Use: 1.2 oz/week    1-2 Shots of liquor per week  . Drug Use: No  . Sexual Activity: Yes    Birth Control/ Protection: Condom   Current Outpatient  Medications on File Prior to Visit  Medication Sig Dispense Refill  . ALPRAZolam (XANAX) 0.5 MG tablet Take 1 tablet (0.5 mg total) by mouth 3 (three) times daily as needed for anxiety. 21 tablet 0  . B Complex-C (B-COMPLEX WITH VITAMIN C) tablet Take 1 tablet by mouth daily.    . Cholecalciferol (VITAMIN D) 2000 UNITS CAPS Take 1 capsule by mouth daily.    . fluticasone (FLONASE) 50 MCG/ACT nasal spray Place 2 sprays into both nostrils daily.    . Ibuprofen (ADVIL) 200 MG CAPS Take by mouth as needed.    . irbesartan (AVAPRO) 150 MG tablet Take 1 tablet (150 mg total) by mouth daily. 30 tablet 1  . LO LOESTRIN FE 1 MG-10 MCG / 10 MCG tablet Take 1 tablet by mouth every evening. 3 Package 4  . Multiple Vitamin (MULTIVITAMIN) tablet Take 1 tablet by mouth daily.      Marland Kitchen OVER THE COUNTER MEDICATION 1 tablet as needed. GABA supplement    . Probiotic Product (PROBIOTIC ACIDOPHILUS PO) Take 1 tablet by mouth daily.      . ranitidine (ZANTAC) 150 MG tablet Take 1 tablet (150 mg total) 2 (two) times daily by mouth. 60 tablet 2  . spironolactone (ALDACTONE) 25 MG tablet Take 1 tablet (25 mg total) daily by mouth. 30 tablet 2   No current facility-administered medications on file prior to visit.    No Known Allergies   Family History  Problem Relation Age of Onset  . Hypertension Mother   . Pulmonary embolism Mother   . Hypertension Father   . Anxiety disorder Father   . Heart disease Maternal Grandmother 54       MI  . Heart disease Maternal Grandfather 25       MI  . Thyroid disease Paternal Aunt   . Cancer Paternal Aunt        breast  . Cancer Maternal Aunt        colon  . Alzheimer's disease Paternal Grandmother   . Thyroid disease Paternal Grandmother   . Cancer Paternal Grandfather 8    PE: BP (!) 150/100   Pulse 77   Wt 163 lb 3.2 oz (74 kg)   LMP 07/06/2017   SpO2 97%   BMI 27.16 kg/m  Body mass index is 27.16 kg/m. Wt Readings from Last 3 Encounters:  08/02/17 163 lb  3.2 oz (74 kg)  07/04/17 166 lb (75.3 kg)  01/06/17 165 lb (74.8 kg)   Constitutional: slightly overweight, in NAD Eyes: PERRLA, EOMI, no exophthalmos ENT: moist mucous membranes, no thyromegaly, no cervical lymphadenopathy Cardiovascular: RRR, No MRG Respiratory: CTA B Gastrointestinal: abdomen soft, NT, ND, BS+ Musculoskeletal: no deformities, strength intact in all 4 Skin: moist, warm, no rashes Neurological: no  tremor with outstretched hands, DTR normal in all 4  ASSESSMENT: 1. Hashimoto thyroiditis - normal TPO antibodies -Slightly high antithyroglobulin antibodies  2. Fatigue  PLAN: 1. Hashimoto thyroiditis -Patient started to feel better after starting to improve her lifestyle (diet, exercise) and coming off gluten she still continues to have some fatigue, but overall, feels better than when I first saw her - We reviewed together her previous thyroid tests and also her antibody levels.  I do not feel that we need to repeat her TPO her ATA antibodies, however, she insists in checking the ATA antibodies as  seeing them improving motivates her to continue her gluten-free diet. She has Hashimoto's thyroiditis with mildly high ATA antibodies.  - I believe that her Hashimoto's thyroiditis course is very indolent due to the very low titer of the ATA antibodies - We again discussed that the treatment of Hashimoto's thyroiditis is limited to thyroid hormones in case her TFTs return abnormal.  For now, we can manage her expectantly.  She is wondering whether a low dose thyroid hormone will not help her symptoms, but we discussed about possible side effects including increasing blood pressure.  Her blood pressure today is high, but she just started irbesartan. - At this visit, we will check her TFTs + ATA (she would like these checked at every visit) - I will see her back in 1 year, but will recheck TFTs in 6 mo  2.  Fatigue - We discussed about the impact of her Hashimoto's thyroiditis on  her fatigue.  As of now, since her ATA antibodies are only mildly high and her TFTs are perfectly normal, I do not feel that this condition impacts her in a significant way. - We reviewed together her previous labs obtained by PCP:   B12 is on the lower end of normal and we discussed about continuing to take B vitamins.  She is wondering if some methylmalonic acid will help, but I advised her that this only helps if B12 was perfectly normal and she had symptoms and signs of low B12.  We also discussed about her sensitivity to gluten and she is continuing on a gluten-free diet.  I advised her to continue for now.  She is wondering whether a test for inflammation would help, but I will leave this at the latitude of her PCP.  We discussed about the difference between ESR and CRP.  She had a normal vitamin 1, 25 dihydroxy vitamin D.  I explained that this reflects only the circulating vitamin D, but she would also need a 25 hydroxy vitamin D to assess her vitamin D stores.  Her CBC did not show anemia  She has fatty liver disease but her LFTs were normal at last check  Component     Latest Ref Rng & Units 08/02/2017  TSH     0.35 - 4.50 uIU/mL 1.05  T4,Free(Direct)     0.60 - 1.60 ng/dL 0.66  Triiodothyronine,Free,Serum     2.3 - 4.2 pg/mL 4.1  Thyroglobulin Ab     < or = 1 IU/mL 4 (H)   TFTs are normal. ATA slightly higher.  Philemon Kingdom, MD PhD [facility]

## 2017-08-03 LAB — THYROGLOBULIN ANTIBODY: THYROGLOBULIN AB: 4 [IU]/mL — AB (ref <=1)

## 2017-08-09 ENCOUNTER — Ambulatory Visit: Payer: BC Managed Care – PPO

## 2017-08-10 ENCOUNTER — Telehealth: Payer: Self-pay | Admitting: Family Medicine

## 2017-08-10 NOTE — Telephone Encounter (Signed)
Called pt to reschedule appt missed on 08/09/2017 due to weather. Left voicemail to return call.

## 2017-08-12 ENCOUNTER — Encounter: Payer: Self-pay | Admitting: Family Medicine

## 2017-08-12 NOTE — Telephone Encounter (Signed)
Ok to go back on losartan and spironolactone Is there any way she can check her bp while she is away?  Or have nurse visit Monday?

## 2017-08-15 NOTE — Telephone Encounter (Signed)
She really needs to be seen

## 2017-08-15 NOTE — Telephone Encounter (Signed)
Called to advise Pt to come in and be seen> Pt couldn't answer the call. Left VM to call back when she gets the chance so we can schedule appointment

## 2017-08-17 ENCOUNTER — Ambulatory Visit: Payer: BC Managed Care – PPO | Admitting: Internal Medicine

## 2017-08-31 ENCOUNTER — Ambulatory Visit (INDEPENDENT_AMBULATORY_CARE_PROVIDER_SITE_OTHER): Payer: BC Managed Care – PPO | Admitting: Medical

## 2017-08-31 VITALS — BP 127/91 | HR 76

## 2017-08-31 DIAGNOSIS — I10 Essential (primary) hypertension: Secondary | ICD-10-CM

## 2017-08-31 NOTE — Progress Notes (Addendum)
Pre visit review using our clinic tool,if applicable. No additional management support is needed unless otherwise documented below in the visit note.   Patient in for BP check per order from Dr.  Floy Sabina dated 08/12/17. Patient requested to go back to Losartan 100 mg daily and she is also taking Spironolactone 25 mg daily.  Home BP's have been as follows, 112/75,118/81,129/82,129/97,133/95  BP today = 127/91 P= 76   Home monitor BP= 146/97 P= 79  Per E. Saguier, PA-C patient to continue takeing medications as ordered and return for BP follow up appointment with Dr. Jewel Baize in 2 months. Patient scheduled appointment at front desk before leaving.  Patient blood pressure was very close to normal/acceptable.  Diastolic was 91.  Her home blood pressure readings are better.  Continue current medications and follow-up in or as needed.  Saguier, Percell Miller, PA-C

## 2017-10-16 ENCOUNTER — Other Ambulatory Visit: Payer: Self-pay | Admitting: Family Medicine

## 2017-10-16 DIAGNOSIS — I10 Essential (primary) hypertension: Secondary | ICD-10-CM

## 2017-11-01 ENCOUNTER — Ambulatory Visit (INDEPENDENT_AMBULATORY_CARE_PROVIDER_SITE_OTHER): Payer: BC Managed Care – PPO | Admitting: Family Medicine

## 2017-11-01 ENCOUNTER — Encounter: Payer: Self-pay | Admitting: Family Medicine

## 2017-11-01 VITALS — BP 134/76 | HR 74 | Temp 98.0°F | Resp 16 | Ht 64.5 in | Wt 165.4 lb

## 2017-11-01 DIAGNOSIS — J301 Allergic rhinitis due to pollen: Secondary | ICD-10-CM | POA: Diagnosis not present

## 2017-11-01 DIAGNOSIS — I10 Essential (primary) hypertension: Secondary | ICD-10-CM

## 2017-11-01 LAB — BASIC METABOLIC PANEL
BUN: 16 mg/dL (ref 6–23)
CALCIUM: 9.2 mg/dL (ref 8.4–10.5)
CO2: 28 mEq/L (ref 19–32)
CREATININE: 0.89 mg/dL (ref 0.40–1.20)
Chloride: 103 mEq/L (ref 96–112)
GFR: 73.36 mL/min (ref >60.00)
Glucose, Bld: 85 mg/dL (ref 70–99)
Potassium: 4.4 mEq/L (ref 3.5–5.1)
Sodium: 137 mEq/L (ref 135–145)

## 2017-11-01 MED ORDER — LOSARTAN POTASSIUM 100 MG PO TABS: 100.0000 mg | ORAL_TABLET | Freq: Every day | ORAL | 3 refills | Status: DC

## 2017-11-01 NOTE — Patient Instructions (Signed)
DASH Eating Plan DASH stands for "Dietary Approaches to Stop Hypertension." The DASH eating plan is a healthy eating plan that has been shown to reduce high blood pressure (hypertension). It may also reduce your risk for type 2 diabetes, heart disease, and stroke. The DASH eating plan may also help with weight loss. What are tips for following this plan? General guidelines  Avoid eating more than 2,300 mg (milligrams) of salt (sodium) a day. If you have hypertension, you may need to reduce your sodium intake to 1,500 mg a day.  Limit alcohol intake to no more than 1 drink a day for nonpregnant women and 2 drinks a day for men. One drink equals 12 oz of beer, 5 oz of wine, or 1 oz of hard liquor.  Work with your health care provider to maintain a healthy body weight or to lose weight. Ask what an ideal weight is for you.  Get at least 30 minutes of exercise that causes your heart to beat faster (aerobic exercise) most days of the week. Activities may include walking, swimming, or biking.  Work with your health care provider or diet and nutrition specialist (dietitian) to adjust your eating plan to your individual calorie needs. Reading food labels  Check food labels for the amount of sodium per serving. Choose foods with less than 5 percent of the Daily Value of sodium. Generally, foods with less than 300 mg of sodium per serving fit into this eating plan.  To find whole grains, look for the word "whole" as the first word in the ingredient list. Shopping  Buy products labeled as "low-sodium" or "no salt added."  Buy fresh foods. Avoid canned foods and premade or frozen meals. Cooking  Avoid adding salt when cooking. Use salt-free seasonings or herbs instead of table salt or sea salt. Check with your health care provider or pharmacist before using salt substitutes.  Do not fry foods. Cook foods using healthy methods such as baking, boiling, grilling, and broiling instead.  Cook with  heart-healthy oils, such as olive, canola, soybean, or sunflower oil. Meal planning   Eat a balanced diet that includes: ? 5 or more servings of fruits and vegetables each day. At each meal, try to fill half of your plate with fruits and vegetables. ? Up to 6-8 servings of whole grains each day. ? Less than 6 oz of lean meat, poultry, or fish each day. A 3-oz serving of meat is about the same size as a deck of cards. One egg equals 1 oz. ? 2 servings of low-fat dairy each day. ? A serving of nuts, seeds, or beans 5 times each week. ? Heart-healthy fats. Healthy fats called Omega-3 fatty acids are found in foods such as flaxseeds and coldwater fish, like sardines, salmon, and mackerel.  Limit how much you eat of the following: ? Canned or prepackaged foods. ? Food that is high in trans fat, such as fried foods. ? Food that is high in saturated fat, such as fatty meat. ? Sweets, desserts, sugary drinks, and other foods with added sugar. ? Full-fat dairy products.  Do not salt foods before eating.  Try to eat at least 2 vegetarian meals each week.  Eat more home-cooked food and less restaurant, buffet, and fast food.  When eating at a restaurant, ask that your food be prepared with less salt or no salt, if possible. What foods are recommended? The items listed may not be a complete list. Talk with your dietitian about what   dietary choices are best for you. Grains Whole-grain or whole-wheat bread. Whole-grain or whole-wheat pasta. Brown rice. Oatmeal. Quinoa. Bulgur. Whole-grain and low-sodium cereals. Pita bread. Low-fat, low-sodium crackers. Whole-wheat flour tortillas. Vegetables Fresh or frozen vegetables (raw, steamed, roasted, or grilled). Low-sodium or reduced-sodium tomato and vegetable juice. Low-sodium or reduced-sodium tomato sauce and tomato paste. Low-sodium or reduced-sodium canned vegetables. Fruits All fresh, dried, or frozen fruit. Canned fruit in natural juice (without  added sugar). Meat and other protein foods Skinless chicken or turkey. Ground chicken or turkey. Pork with fat trimmed off. Fish and seafood. Egg whites. Dried beans, peas, or lentils. Unsalted nuts, nut butters, and seeds. Unsalted canned beans. Lean cuts of beef with fat trimmed off. Low-sodium, lean deli meat. Dairy Low-fat (1%) or fat-free (skim) milk. Fat-free, low-fat, or reduced-fat cheeses. Nonfat, low-sodium ricotta or cottage cheese. Low-fat or nonfat yogurt. Low-fat, low-sodium cheese. Fats and oils Soft margarine without trans fats. Vegetable oil. Low-fat, reduced-fat, or light mayonnaise and salad dressings (reduced-sodium). Canola, safflower, olive, soybean, and sunflower oils. Avocado. Seasoning and other foods Herbs. Spices. Seasoning mixes without salt. Unsalted popcorn and pretzels. Fat-free sweets. What foods are not recommended? The items listed may not be a complete list. Talk with your dietitian about what dietary choices are best for you. Grains Baked goods made with fat, such as croissants, muffins, or some breads. Dry pasta or rice meal packs. Vegetables Creamed or fried vegetables. Vegetables in a cheese sauce. Regular canned vegetables (not low-sodium or reduced-sodium). Regular canned tomato sauce and paste (not low-sodium or reduced-sodium). Regular tomato and vegetable juice (not low-sodium or reduced-sodium). Pickles. Olives. Fruits Canned fruit in a light or heavy syrup. Fried fruit. Fruit in cream or butter sauce. Meat and other protein foods Fatty cuts of meat. Ribs. Fried meat. Bacon. Sausage. Bologna and other processed lunch meats. Salami. Fatback. Hotdogs. Bratwurst. Salted nuts and seeds. Canned beans with added salt. Canned or smoked fish. Whole eggs or egg yolks. Chicken or turkey with skin. Dairy Whole or 2% milk, cream, and half-and-half. Whole or full-fat cream cheese. Whole-fat or sweetened yogurt. Full-fat cheese. Nondairy creamers. Whipped toppings.  Processed cheese and cheese spreads. Fats and oils Butter. Stick margarine. Lard. Shortening. Ghee. Bacon fat. Tropical oils, such as coconut, palm kernel, or palm oil. Seasoning and other foods Salted popcorn and pretzels. Onion salt, garlic salt, seasoned salt, table salt, and sea salt. Worcestershire sauce. Tartar sauce. Barbecue sauce. Teriyaki sauce. Soy sauce, including reduced-sodium. Steak sauce. Canned and packaged gravies. Fish sauce. Oyster sauce. Cocktail sauce. Horseradish that you find on the shelf. Ketchup. Mustard. Meat flavorings and tenderizers. Bouillon cubes. Hot sauce and Tabasco sauce. Premade or packaged marinades. Premade or packaged taco seasonings. Relishes. Regular salad dressings. Where to find more information:  National Heart, Lung, and Blood Institute: www.nhlbi.nih.gov  American Heart Association: www.heart.org Summary  The DASH eating plan is a healthy eating plan that has been shown to reduce high blood pressure (hypertension). It may also reduce your risk for type 2 diabetes, heart disease, and stroke.  With the DASH eating plan, you should limit salt (sodium) intake to 2,300 mg a day. If you have hypertension, you may need to reduce your sodium intake to 1,500 mg a day.  When on the DASH eating plan, aim to eat more fresh fruits and vegetables, whole grains, lean proteins, low-fat dairy, and heart-healthy fats.  Work with your health care provider or diet and nutrition specialist (dietitian) to adjust your eating plan to your individual   calorie needs. This information is not intended to replace advice given to you by your health care provider. Make sure you discuss any questions you have with your health care provider. Document Released: 08/05/2011 Document Revised: 08/09/2016 Document Reviewed: 08/09/2016 Elsevier Interactive Patient Education  2018 [employer]  

## 2017-11-01 NOTE — Assessment & Plan Note (Signed)
con't otc flonase and antihistamine Do not take decongestants Will consider astelin or singulair if

## 2017-11-01 NOTE — Progress Notes (Signed)
Subjective:  I acted as a Education administrator for [employer]. Jacqueline Calhoun, [name]   Patient ID: Jacqueline Calhoun, female    DOB: September 25, 1975, 42 y.o.   MRN: [id number]  Chief Complaint  Patient presents with  . Follow-up    HPI  Patient is in today for recheck on her blood pressure.  No complaints except she is struggling with sinus headaches.  She is taking flonase and antihistamine and wants to know what otc she can take.    Patient Care Team: Carollee Herter, Alferd Apa, DO as PCP - General (Family Medicine)   Past Medical History:  Diagnosis Date  . Anxiety   . Bladder tumor   . Depression   . Hashimoto's thyroiditis    dx 01/ 2016-- endocrinologist-  dr Cruzita Lederer  . History of ovarian cyst   . Hypertension   . IBS (irritable bowel syndrome)    eat gluten free and take probiotic  . Wears glasses     Past Surgical History:  Procedure Laterality Date  . NO PAST SURGERIES    . TRANSURETHRAL RESECTION OF BLADDER TUMOR N/A 07/12/2016   Procedure: TRANSURETHRAL RESECTION OF BLADDER TUMOR (TURBT);  Surgeon: Nickie Retort, MD;  Location: [facility];  Service: Urology;  Laterality: N/A;    Family History  Problem Relation Age of Onset  . Hypertension Mother   . Pulmonary embolism Mother   . Hypertension Father   . Anxiety disorder Father   . Heart disease Maternal Grandmother 67       MI  . Heart disease Maternal Grandfather 64       MI  . Thyroid disease Paternal Aunt   . Cancer Paternal Aunt        breast  . Cancer Maternal Aunt        colon  . Alzheimer's disease Paternal Grandmother   . Thyroid disease Paternal Grandmother   . Cancer Paternal Grandfather 18    Social History   Socioeconomic History  . Marital status: Married    Spouse name: Not on file  . Number of children: Not on file  . Years of education: Not on file  . Highest education level: Not on file  Social Needs  . Financial resource strain: Not on file  . Food insecurity - worry: Not on file  .  Food insecurity - inability: Not on file  . Transportation needs - medical: Not on file  . Transportation needs - non-medical: Not on file  Occupational History  . Not on file  Tobacco Use  . Smoking status: Never Smoker  . Smokeless tobacco: Never Used  Substance and Sexual Activity  . Alcohol use: Yes    Comment: occasional  . Drug use: No  . Sexual activity: Yes    Birth control/protection: Pill  Other Topics Concern  . Not on file  Social History Narrative  . Not on file    Outpatient Medications Prior to Visit  Medication Sig Dispense Refill  . B Complex-C (B-COMPLEX WITH VITAMIN C) tablet Take 1 tablet by mouth daily.    . Cholecalciferol (VITAMIN D) 2000 UNITS CAPS Take 1 capsule by mouth daily.    . fluticasone (FLONASE) 50 MCG/ACT nasal spray Place 2 sprays into both nostrils daily.    . Ibuprofen (ADVIL) 200 MG CAPS Take by mouth as needed.    . LO LOESTRIN FE 1 MG-10 MCG / 10 MCG tablet Take 1 tablet by mouth every evening. 3 Package 4  . Multiple Vitamin (  MULTIVITAMIN) tablet Take 1 tablet by mouth daily.      Marland Kitchen OVER THE COUNTER MEDICATION 1 tablet as needed. GABA supplement    . Probiotic Product (PROBIOTIC ACIDOPHILUS PO) Take 1 tablet by mouth daily.      . ranitidine (ZANTAC) 150 MG tablet Take 1 tablet (150 mg total) 2 (two) times daily by mouth. 60 tablet 2  . spironolactone (ALDACTONE) 25 MG tablet TAKE 1 TABLET(25 MG) BY MOUTH DAILY 30 tablet 0  . ALPRAZolam (XANAX) 0.5 MG tablet Take 1 tablet (0.5 mg total) by mouth 3 (three) times daily as needed for anxiety. (Patient not taking: Reported on 11/01/2017) 21 tablet 0  . irbesartan (AVAPRO) 150 MG tablet Take 1 tablet (150 mg total) by mouth daily. (Patient not taking: Reported on 11/01/2017) 30 tablet 1   No facility-administered medications prior to visit.     No Known Allergies  Review of Systems  Constitutional: Negative for chills, fever and malaise/fatigue.  HENT: Negative for congestion and hearing  loss.   Eyes: Negative for discharge.  Respiratory: Negative for cough, sputum production and shortness of breath.   Cardiovascular: Negative for chest pain, palpitations and leg swelling.  Gastrointestinal: Negative for abdominal pain, blood in stool, constipation, diarrhea, heartburn, nausea and vomiting.  Genitourinary: Negative for dysuria, frequency, hematuria and urgency.  Musculoskeletal: Negative for back pain, falls and myalgias.  Skin: Negative for rash.  Neurological: Negative for dizziness, sensory change, loss of consciousness, weakness and headaches.  Endo/Heme/Allergies: Negative for environmental allergies. Does not bruise/bleed easily.  Psychiatric/Behavioral: Negative for depression and suicidal ideas. The patient is not nervous/anxious and does not have insomnia.        Objective:    Physical Exam  Constitutional: She is oriented to person, place, and time. She appears well-developed and well-nourished.  HENT:  Head: Normocephalic and atraumatic.  Eyes: Conjunctivae and EOM are normal.  Neck: Normal range of motion. Neck supple. No JVD present. Carotid bruit is not present. No thyromegaly present.  Cardiovascular: Normal rate, regular rhythm and normal heart sounds.  No murmur heard. Pulmonary/Chest: Effort normal and breath sounds normal. No respiratory distress. She has no wheezes. She has no rales. She exhibits no tenderness.  Musculoskeletal: She exhibits no edema.  Neurological: She is alert and oriented to person, place, and time.  Psychiatric: She has a normal mood and affect.  Nursing note and vitals reviewed.   BP 134/76 (BP Location: Left Arm, Patient Position: Sitting, Cuff Size: Normal)   Pulse 74   Temp 98 F (36.7 C) (Oral)   Resp 16   Ht 5' 4.5" (1.638 m)   Wt 165 lb 6.4 oz (75 kg)   SpO2 98%   BMI 27.95 kg/m  Wt Readings from Last 3 Encounters:  11/01/17 165 lb 6.4 oz (75 kg)  08/02/17 163 lb 3.2 oz (74 kg)  07/04/17 166 lb (75.3 kg)    BP Readings from Last 3 Encounters:  11/01/17 134/76  08/31/17 (!) 127/91  08/02/17 (!) 150/100     Immunization History  Administered Date(s) Administered  . Influenza Split 06/26/2012  . Influenza,inj,Quad PF,6+ Mos 05/15/2013, 07/04/2017  . Td 09/06/2007    Health Maintenance  Topic Date Due  . HIV Screening  03/17/1989  . MAMMOGRAM  11/03/2016  . TETANUS/TDAP  09/05/2017  . PAP SMEAR  01/07/2020  . INFLUENZA VACCINE  Completed    Lab Results  Component Value Date   WBC 7.6 01/06/2017   HGB 14.5 01/06/2017  HCT 43.7 01/06/2017   PLT 304 01/06/2017   GLUCOSE 80 01/06/2017   CHOL 167 01/06/2017   TRIG 94 01/06/2017   HDL 53 01/06/2017   LDLCALC 95 01/06/2017   ALT 23 01/06/2017   AST 22 01/06/2017   NA 137 01/06/2017   K 4.4 01/06/2017   CL 105 01/06/2017   CREATININE 0.93 01/06/2017   BUN 12 01/06/2017   CO2 21 01/06/2017   TSH 1.05 08/02/2017   INR 1.00 07/12/2016    Lab Results  Component Value Date   TSH 1.05 08/02/2017   Lab Results  Component Value Date   WBC 7.6 01/06/2017   HGB 14.5 01/06/2017   HCT 43.7 01/06/2017   MCV 88.3 01/06/2017   PLT 304 01/06/2017   Lab Results  Component Value Date   NA 137 01/06/2017   K 4.4 01/06/2017   CO2 21 01/06/2017   GLUCOSE 80 01/06/2017   BUN 12 01/06/2017   CREATININE 0.93 01/06/2017   BILITOT 0.5 01/06/2017   ALKPHOS 47 01/06/2017   AST 22 01/06/2017   ALT 23 01/06/2017   PROT 6.6 01/06/2017   ALBUMIN 4.0 01/06/2017   CALCIUM 9.0 01/06/2017   ANIONGAP 9 07/12/2016   GFR 71.19 02/04/2016   Lab Results  Component Value Date   CHOL 167 01/06/2017   Lab Results  Component Value Date   HDL 53 01/06/2017   Lab Results  Component Value Date   LDLCALC 95 01/06/2017   Lab Results  Component Value Date   TRIG 94 01/06/2017   Lab Results  Component Value Date   CHOLHDL 3.2 01/06/2017   No results found for: HGBA1C       Assessment & Plan:   Problem List Items Addressed This  Visit      Unprioritized   Allergic rhinitis    con't otc flonase and antihistamine Do not take decongestants Will consider astelin or singulair if       Hypertension - Primary   Relevant Medications   losartan (COZAAR) 100 MG tablet   Other Relevant Orders   Basic metabolic panel    dash diet Recheck 1 month--- if still running 130s/ 90s will consider norvasc 5 mg  Pt will schedule cpe as well   I have discontinued Bethanne Kassabian's irbesartan. I am also having her start on losartan. Additionally, I am having her maintain her Probiotic Product (PROBIOTIC ACIDOPHILUS PO), multivitamin, B-complex with vitamin C, Vitamin D, Ibuprofen, fluticasone, OVER THE COUNTER MEDICATION, ALPRAZolam, LO LOESTRIN FE, ranitidine, and spironolactone.  Meds ordered this encounter  Medications  . losartan (COZAAR) 100 MG tablet    Sig: Take 1 tablet (100 mg total) by mouth daily.    Dispense:  90 tablet    Refill:  3    CMA served as scribe during this visit. History, Physical and Plan performed by medical provider. Documentation and orders reviewed and attested to.  Ann Held, DO

## 2017-11-16 ENCOUNTER — Other Ambulatory Visit: Payer: Self-pay | Admitting: Family Medicine

## 2017-11-16 DIAGNOSIS — I10 Essential (primary) hypertension: Secondary | ICD-10-CM

## 2017-11-29 ENCOUNTER — Ambulatory Visit: Payer: BC Managed Care – PPO | Admitting: *Deleted

## 2017-11-29 NOTE — Progress Notes (Unsigned)
Patient rescheduled nurse visit today due to long wait time. Jacqueline Calhoun and  I Explained to patient she was next and we were going to get her. We had to wait on Doctor's advise regarding the previous patients on the nurse visit schedule before her.

## 2017-12-06 ENCOUNTER — Ambulatory Visit (INDEPENDENT_AMBULATORY_CARE_PROVIDER_SITE_OTHER): Payer: BC Managed Care – PPO | Admitting: Family Medicine

## 2017-12-06 VITALS — BP 126/89 | HR 75 | Resp 16

## 2017-12-06 DIAGNOSIS — I10 Essential (primary) hypertension: Secondary | ICD-10-CM | POA: Diagnosis not present

## 2017-12-06 NOTE — Progress Notes (Signed)
Pre visit review using our clinic review tool, if applicable. No additional management support is needed unless otherwise documented below in the visit note.  Pt here for blood pressure check per Dr Carollee Herter.  BP Readings from Last 3 Encounters:  11/01/17 134/76  08/31/17 (!) 127/91  08/02/17 (!) 150/100    Pt currently taking: Losartan 100mg  once a day Spironolactone 25mg  once a day.  BP @ 9:22am = 140 90 HR =  75  Repeat BP @ 9:39am = 126/89  Continue current medications and follow up as scheduled with Dr Carollee Herter on 01/30/18.   Reviewed----Yvonne R Carollee Herter, DO

## 2017-12-15 ENCOUNTER — Other Ambulatory Visit: Payer: Self-pay | Admitting: Family Medicine

## 2017-12-15 DIAGNOSIS — R1013 Epigastric pain: Secondary | ICD-10-CM

## 2017-12-15 DIAGNOSIS — I10 Essential (primary) hypertension: Secondary | ICD-10-CM

## 2018-01-10 ENCOUNTER — Other Ambulatory Visit: Payer: Self-pay | Admitting: Women's Health

## 2018-01-10 ENCOUNTER — Ambulatory Visit (INDEPENDENT_AMBULATORY_CARE_PROVIDER_SITE_OTHER): Payer: BC Managed Care – PPO

## 2018-01-10 ENCOUNTER — Ambulatory Visit: Payer: BC Managed Care – PPO | Admitting: Women's Health

## 2018-01-10 ENCOUNTER — Encounter: Payer: Self-pay | Admitting: Women's Health

## 2018-01-10 VITALS — BP 132/80

## 2018-01-10 DIAGNOSIS — R1032 Left lower quadrant pain: Secondary | ICD-10-CM

## 2018-01-10 DIAGNOSIS — R9389 Abnormal findings on diagnostic imaging of other specified body structures: Secondary | ICD-10-CM

## 2018-01-10 DIAGNOSIS — R140 Abdominal distension (gaseous): Secondary | ICD-10-CM

## 2018-01-10 DIAGNOSIS — N8300 Follicular cyst of ovary, unspecified side: Secondary | ICD-10-CM | POA: Diagnosis not present

## 2018-01-10 NOTE — Progress Notes (Signed)
42 year old MWF G1 P1 with complaint of left sided abdominal/pelvic pressure bloating, vaginal spotting with bearing down with bowel movements for the past week.  Denies rectal bleeding.  History of occasional intermenstrual bleeding/no change.  Denies vaginal discharge, urinary symptoms, nausea, constipation, or fever. Currently on lo Loestrin with no missed pills, notes spotting if pill taken  late.  07/2017 normal thyroid function, positive thyroid antibody.  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  Medical problems include hypertension, and IBS with no recent change of medication.    Exam: Appears well.  No CVAT, abdomen soft without rebound or radiation of pain. Ultrasound: T/V uterus anteverted homogeneous.  Endometrium solid, 8 mm, day 12 of cycle, negative CFD.  Right ovary normal, left ovary thin-walled echo-free follicle 27 x 25 mm.  Negative cul-de-sac.  No apparent mass right or left adnexal.  Bloating/thickened endometrium Spotting on lo Loestrin 2017 bladder cancer surgical resection  Plan: Ultrasound reviewed normality of follicle, reviewed endometrium appeared thicker than expected, sonohysterogram with Dr. Dellis Filbert.  Continue lo Loestrin, Motrin as needed for discomfort.

## 2018-01-24 ENCOUNTER — Other Ambulatory Visit: Payer: Self-pay | Admitting: Family Medicine

## 2018-01-24 DIAGNOSIS — I10 Essential (primary) hypertension: Secondary | ICD-10-CM

## 2018-01-30 ENCOUNTER — Encounter: Payer: Self-pay | Admitting: Family Medicine

## 2018-01-30 ENCOUNTER — Ambulatory Visit: Payer: BC Managed Care – PPO | Admitting: Family Medicine

## 2018-01-30 ENCOUNTER — Other Ambulatory Visit: Payer: Self-pay | Admitting: Internal Medicine

## 2018-01-30 ENCOUNTER — Encounter: Payer: Self-pay | Admitting: Internal Medicine

## 2018-01-30 VITALS — BP 128/86 | HR 71 | Temp 98.3°F | Resp 16 | Ht 65.0 in | Wt 158.8 lb

## 2018-01-30 DIAGNOSIS — I10 Essential (primary) hypertension: Secondary | ICD-10-CM | POA: Diagnosis not present

## 2018-01-30 DIAGNOSIS — E538 Deficiency of other specified B group vitamins: Secondary | ICD-10-CM | POA: Diagnosis not present

## 2018-01-30 DIAGNOSIS — E063 Autoimmune thyroiditis: Secondary | ICD-10-CM

## 2018-01-30 LAB — COMPREHENSIVE METABOLIC PANEL
ALBUMIN: 4.3 g/dL (ref 3.5–5.2)
ALK PHOS: 55 U/L (ref 39–117)
ALT: 25 U/L (ref 0–35)
AST: 20 U/L (ref 0–37)
BILIRUBIN TOTAL: 0.7 mg/dL (ref 0.2–1.2)
BUN: 8 mg/dL (ref 6–23)
CO2: 28 mEq/L (ref 19–32)
CREATININE: 0.9 mg/dL (ref 0.40–1.20)
Calcium: 9.5 mg/dL (ref 8.4–10.5)
Chloride: 103 mEq/L (ref 96–112)
GFR: 72.34 mL/min (ref >60.00)
Glucose, Bld: 85 mg/dL (ref 70–99)
Potassium: 4.6 mEq/L (ref 3.5–5.1)
SODIUM: 138 meq/L (ref 135–145)
TOTAL PROTEIN: 6.8 g/dL (ref 6.0–8.3)

## 2018-01-30 LAB — LIPID PANEL
CHOLESTEROL: 173 mg/dL (ref 0–200)
HDL: 50.8 mg/dL (ref >39.00)
LDL Cholesterol: 97 mg/dL (ref 0–99)
NonHDL: 121.83
Total CHOL/HDL Ratio: 3
Triglycerides: 124 mg/dL (ref 0.0–149.0)
VLDL: 24.8 mg/dL (ref 0.0–40.0)

## 2018-01-30 LAB — VITAMIN B12: VITAMIN B 12: 620 pg/mL (ref 211–911)

## 2018-01-30 MED ORDER — SPIRONOLACTONE 25 MG PO TABS: ORAL_TABLET | ORAL | 3 refills | Status: DC

## 2018-01-30 NOTE — Progress Notes (Signed)
Patient ID: Jacqueline Calhoun, female    DOB: 1976/08/23  Age: 42 y.o. MRN: [id number]    Subjective:    History Past Medical History:  Diagnosis Date  . Anxiety   . Bladder tumor   . Depression   . Hashimoto's thyroiditis    dx 01/ 2016-- endocrinologist-  dr Cruzita Lederer  . History of ovarian cyst   . Hypertension   . IBS (irritable bowel syndrome)    eat gluten free and take probiotic  . Wears glasses     She has a past surgical history that includes No past surgeries and Transurethral resection of bladder tumor (N/A, 07/12/2016).   Her family history includes Alzheimer's disease in her paternal grandmother; Anxiety disorder in her father; Cancer in her maternal aunt and paternal aunt; Cancer (age of onset: 30) in her paternal grandfather; Heart disease (age of onset: 79) in her maternal grandfather and maternal grandmother; Hypertension in her father and mother; Pulmonary embolism in her mother; Thyroid disease in her paternal aunt and paternal grandmother.She reports that she has never smoked. She has never used smokeless tobacco. She reports that she drinks alcohol. She reports that she does not use drugs.  Current Outpatient Medications on File Prior to Visit  Medication Sig Dispense Refill  . B Complex-C (B-COMPLEX WITH VITAMIN C) tablet Take 1 tablet by mouth daily.    . Cholecalciferol (VITAMIN D) 2000 UNITS CAPS Take 1 capsule by mouth daily.    . fluticasone (FLONASE) 50 MCG/ACT nasal spray Place 2 sprays into both nostrils daily.    . Ibuprofen (ADVIL) 200 MG CAPS Take by mouth as needed.    . LO LOESTRIN FE 1 MG-10 MCG / 10 MCG tablet Take 1 tablet by mouth every evening. 3 Package 4  . losartan (COZAAR) 100 MG tablet Take 1 tablet (100 mg total) by mouth daily. 90 tablet 3  . Multiple Vitamin (MULTIVITAMIN) tablet Take 1 tablet by mouth daily.      Marland Kitchen OVER THE COUNTER MEDICATION 1 tablet as needed. GABA supplement    . Probiotic Product (PROBIOTIC ACIDOPHILUS PO) Take 1 tablet  by mouth daily.      Marland Kitchen spironolactone (ALDACTONE) 25 MG tablet TAKE 1 TABLET(25 MG) BY MOUTH DAILY 30 tablet 0   No current facility-administered medications on file prior to visit.      Objective:       Assessment & Plan:

## 2018-01-30 NOTE — Progress Notes (Signed)
Patient ID: Jacqueline Calhoun, female   DOB: 1975/11/09, 42 y.o.   MRN: [id number]     Subjective:  I acted as a Education administrator for Dr. Carollee Herter.  Jacqueline Calhoun, Jacqueline Calhoun   Patient ID: Jacqueline Calhoun, female    DOB: 1976/06/11, 42 y.o.   MRN: [id number]  Chief Complaint  Patient presents with  . Hypertension    HPI  Patient is in today for follow up blood pressure.   Patient Care Team: Carollee Herter, Alferd Apa, DO as PCP - General (Family Medicine)   Past Medical History:  Diagnosis Date  . Anxiety   . Bladder tumor   . Depression   . Hashimoto's thyroiditis    dx 01/ 2016-- endocrinologist-  dr Cruzita Lederer  . History of ovarian cyst   . Hypertension   . IBS (irritable bowel syndrome)    eat gluten free and take probiotic  . Wears glasses     Past Surgical History:  Procedure Laterality Date  . NO PAST SURGERIES    . TRANSURETHRAL RESECTION OF BLADDER TUMOR N/A 07/12/2016   Procedure: TRANSURETHRAL RESECTION OF BLADDER TUMOR (TURBT);  Surgeon: Nickie Retort, MD;  Location: [facility];  Service: Urology;  Laterality: N/A;    Family History  Problem Relation Age of Onset  . Hypertension Mother   . Pulmonary embolism Mother   . Hypertension Father   . Anxiety disorder Father   . Heart disease Maternal Grandmother 25       MI  . Heart disease Maternal Grandfather 65       MI  . Thyroid disease Paternal Aunt   . Cancer Paternal Aunt        breast  . Cancer Maternal Aunt        colon  . Alzheimer's disease Paternal Grandmother   . Thyroid disease Paternal Grandmother   . Cancer Paternal Grandfather 58    Social History   Socioeconomic History  . Marital status: Married    Spouse name: Not on file  . Number of children: Not on file  . Years of education: Not on file  . Highest education level: Not on file  Occupational History  . Not on file  Social Needs  . Financial resource strain: Not on file  . Food insecurity:    Worry: Not on file    Inability: Not on  file  . Transportation needs:    Medical: Not on file    Non-medical: Not on file  Tobacco Use  . Smoking status: Never Smoker  . Smokeless tobacco: Never Used  Substance and Sexual Activity  . Alcohol use: Yes    Comment: occasional  . Drug use: No  . Sexual activity: Yes    Birth control/protection: Pill  Lifestyle  . Physical activity:    Days per week: Not on file    Minutes per session: Not on file  . Stress: Not on file  Relationships  . Social connections:    Talks on phone: Not on file    Gets together: Not on file    Attends religious service: Not on file    Active member of club or organization: Not on file    Attends meetings of clubs or organizations: Not on file    Relationship status: Not on file  . Intimate partner violence:    Fear of current or ex partner: Not on file    Emotionally abused: Not on file    Physically abused: Not on file  Forced sexual activity: Not on file  Other Topics Concern  . Not on file  Social History Narrative  . Not on file    Outpatient Medications Prior to Visit  Medication Sig Dispense Refill  . B Complex-C (B-COMPLEX WITH VITAMIN C) tablet Take 1 tablet by mouth daily.    . Cholecalciferol (VITAMIN D) 2000 UNITS CAPS Take 1 capsule by mouth daily.    . fluticasone (FLONASE) 50 MCG/ACT nasal spray Place 2 sprays into both nostrils daily.    . Ibuprofen (ADVIL) 200 MG CAPS Take by mouth as needed.    . LO LOESTRIN FE 1 MG-10 MCG / 10 MCG tablet Take 1 tablet by mouth every evening. 3 Package 4  . losartan (COZAAR) 100 MG tablet Take 1 tablet (100 mg total) by mouth daily. 90 tablet 3  . Multiple Vitamin (MULTIVITAMIN) tablet Take 1 tablet by mouth daily.      Marland Kitchen OVER THE COUNTER MEDICATION 1 tablet as needed. GABA supplement    . Probiotic Product (PROBIOTIC ACIDOPHILUS PO) Take 1 tablet by mouth daily.      Marland Kitchen spironolactone (ALDACTONE) 25 MG tablet TAKE 1 TABLET(25 MG) BY MOUTH DAILY 30 tablet 0  . ranitidine (ZANTAC) 150  MG tablet TAKE 1 TABLET(150 MG) BY MOUTH TWICE DAILY 60 tablet 0   No facility-administered medications prior to visit.     No Known Allergies  Review of Systems  Constitutional: Negative for fever and malaise/fatigue.  HENT: Negative for congestion.   Eyes: Negative for blurred vision.  Respiratory: Negative for cough and shortness of breath.   Cardiovascular: Negative for chest pain, palpitations and leg swelling.  Gastrointestinal: Negative for vomiting.  Musculoskeletal: Negative for back pain.  Skin: Negative for rash.  Neurological: Negative for loss of consciousness and headaches.       Objective:    Physical Exam  BP 128/86 (BP Location: Left Arm, Cuff Size: Normal)   Pulse 71   Temp 98.3 F (36.8 C) (Oral)   Resp 16   Ht 5\' 5"  (1.651 m)   Wt 158 lb 12.8 oz (72 kg)   LMP 01/25/2018   SpO2 98%   BMI 26.43 kg/m  Wt Readings from Last 3 Encounters:  01/30/18 158 lb 12.8 oz (72 kg)  11/01/17 165 lb 6.4 oz (75 kg)  08/02/17 163 lb 3.2 oz (74 kg)   BP Readings from Last 3 Encounters:  01/30/18 128/86  01/10/18 132/80  12/06/17 126/89     Immunization History  Administered Date(s) Administered  . Influenza Split 06/26/2012  . Influenza,inj,Quad PF,6+ Mos 05/15/2013, 07/04/2017  . Td 09/06/2007    Health Maintenance  Topic Date Due  . HIV Screening  03/17/1989  . MAMMOGRAM  11/03/2016  . TETANUS/TDAP  09/05/2017  . INFLUENZA VACCINE  03/30/2018  . PAP SMEAR  01/07/2020    Lab Results  Component Value Date   WBC 7.6 01/06/2017   HGB 14.5 01/06/2017   HCT 43.7 01/06/2017   PLT 304 01/06/2017   GLUCOSE 85 11/01/2017   CHOL 167 01/06/2017   TRIG 94 01/06/2017   HDL 53 01/06/2017   LDLCALC 95 01/06/2017   ALT 23 01/06/2017   AST 22 01/06/2017   NA 137 11/01/2017   K 4.4 11/01/2017   CL 103 11/01/2017   CREATININE 0.89 11/01/2017   BUN 16 11/01/2017   CO2 28 11/01/2017   TSH 1.05 08/02/2017   INR 1.00 07/12/2016    Lab Results  Component  Value Date  TSH 1.05 08/02/2017   Lab Results  Component Value Date   WBC 7.6 01/06/2017   HGB 14.5 01/06/2017   HCT 43.7 01/06/2017   MCV 88.3 01/06/2017   PLT 304 01/06/2017   Lab Results  Component Value Date   NA 137 11/01/2017   K 4.4 11/01/2017   CO2 28 11/01/2017   GLUCOSE 85 11/01/2017   BUN 16 11/01/2017   CREATININE 0.89 11/01/2017   BILITOT 0.5 01/06/2017   ALKPHOS 47 01/06/2017   AST 22 01/06/2017   ALT 23 01/06/2017   PROT 6.6 01/06/2017   ALBUMIN 4.0 01/06/2017   CALCIUM 9.2 11/01/2017   ANIONGAP 9 07/12/2016   GFR 73.36 11/01/2017   Lab Results  Component Value Date   CHOL 167 01/06/2017   Lab Results  Component Value Date   HDL 53 01/06/2017   Lab Results  Component Value Date   LDLCALC 95 01/06/2017   Lab Results  Component Value Date   TRIG 94 01/06/2017   Lab Results  Component Value Date   CHOLHDL 3.2 01/06/2017   No results found for: HGBA1C       Assessment & Plan:   Problem List Items Addressed This Visit      Unprioritized   Hypertension - Primary   Relevant Orders   Comprehensive metabolic panel   Lipid panel      I have discontinued Christina Sara's ranitidine. I am also having her maintain her Probiotic Product (PROBIOTIC ACIDOPHILUS PO), multivitamin, B-complex with vitamin C, Vitamin D, Ibuprofen, fluticasone, OVER THE COUNTER MEDICATION, LO LOESTRIN FE, losartan, and spironolactone.  No orders of the defined types were placed in this encounter.   CMA served as Education administrator during this visit. History, Physical and Plan performed by medical provider. Documentation and orders reviewed and attested to.  Ann Held, DO

## 2018-01-30 NOTE — Patient Instructions (Signed)
DASH Eating Plan DASH stands for "Dietary Approaches to Stop Hypertension." The DASH eating plan is a healthy eating plan that has been shown to reduce high blood pressure (hypertension). It may also reduce your risk for type 2 diabetes, heart disease, and stroke. The DASH eating plan may also help with weight loss. What are tips for following this plan? General guidelines  Avoid eating more than 2,300 mg (milligrams) of salt (sodium) a day. If you have hypertension, you may need to reduce your sodium intake to 1,500 mg a day.  Limit alcohol intake to no more than 1 drink a day for nonpregnant women and 2 drinks a day for men. One drink equals 12 oz of beer, 5 oz of wine, or 1 oz of hard liquor.  Work with your health care provider to maintain a healthy body weight or to lose weight. Ask what an ideal weight is for you.  Get at least 30 minutes of exercise that causes your heart to beat faster (aerobic exercise) most days of the week. Activities may include walking, swimming, or biking.  Work with your health care provider or diet and nutrition specialist (dietitian) to adjust your eating plan to your individual calorie needs. Reading food labels  Check food labels for the amount of sodium per serving. Choose foods with less than 5 percent of the Daily Value of sodium. Generally, foods with less than 300 mg of sodium per serving fit into this eating plan.  To find whole grains, look for the word "whole" as the first word in the ingredient list. Shopping  Buy products labeled as "low-sodium" or "no salt added."  Buy fresh foods. Avoid canned foods and premade or frozen meals. Cooking  Avoid adding salt when cooking. Use salt-free seasonings or herbs instead of table salt or sea salt. Check with your health care provider or pharmacist before using salt substitutes.  Do not fry foods. Cook foods using healthy methods such as baking, boiling, grilling, and broiling instead.  Cook with  heart-healthy oils, such as olive, canola, soybean, or sunflower oil. Meal planning   Eat a balanced diet that includes: ? 5 or more servings of fruits and vegetables each day. At each meal, try to fill half of your plate with fruits and vegetables. ? Up to 6-8 servings of whole grains each day. ? Less than 6 oz of lean meat, poultry, or fish each day. A 3-oz serving of meat is about the same size as a deck of cards. One egg equals 1 oz. ? 2 servings of low-fat dairy each day. ? A serving of nuts, seeds, or beans 5 times each week. ? Heart-healthy fats. Healthy fats called Omega-3 fatty acids are found in foods such as flaxseeds and coldwater fish, like sardines, salmon, and mackerel.  Limit how much you eat of the following: ? Canned or prepackaged foods. ? Food that is high in trans fat, such as fried foods. ? Food that is high in saturated fat, such as fatty meat. ? Sweets, desserts, sugary drinks, and other foods with added sugar. ? Full-fat dairy products.  Do not salt foods before eating.  Try to eat at least 2 vegetarian meals each week.  Eat more home-cooked food and less restaurant, buffet, and fast food.  When eating at a restaurant, ask that your food be prepared with less salt or no salt, if possible. What foods are recommended? The items listed may not be a complete list. Talk with your dietitian about what   dietary choices are best for you. Grains Whole-grain or whole-wheat bread. Whole-grain or whole-wheat pasta. Brown rice. Oatmeal. Quinoa. Bulgur. Whole-grain and low-sodium cereals. Pita bread. Low-fat, low-sodium crackers. Whole-wheat flour tortillas. Vegetables Fresh or frozen vegetables (raw, steamed, roasted, or grilled). Low-sodium or reduced-sodium tomato and vegetable juice. Low-sodium or reduced-sodium tomato sauce and tomato paste. Low-sodium or reduced-sodium canned vegetables. Fruits All fresh, dried, or frozen fruit. Canned fruit in natural juice (without  added sugar). Meat and other protein foods Skinless chicken or turkey. Ground chicken or turkey. Pork with fat trimmed off. Fish and seafood. Egg whites. Dried beans, peas, or lentils. Unsalted nuts, nut butters, and seeds. Unsalted canned beans. Lean cuts of beef with fat trimmed off. Low-sodium, lean deli meat. Dairy Low-fat (1%) or fat-free (skim) milk. Fat-free, low-fat, or reduced-fat cheeses. Nonfat, low-sodium ricotta or cottage cheese. Low-fat or nonfat yogurt. Low-fat, low-sodium cheese. Fats and oils Soft margarine without trans fats. Vegetable oil. Low-fat, reduced-fat, or light mayonnaise and salad dressings (reduced-sodium). Canola, safflower, olive, soybean, and sunflower oils. Avocado. Seasoning and other foods Herbs. Spices. Seasoning mixes without salt. Unsalted popcorn and pretzels. Fat-free sweets. What foods are not recommended? The items listed may not be a complete list. Talk with your dietitian about what dietary choices are best for you. Grains Baked goods made with fat, such as croissants, muffins, or some breads. Dry pasta or rice meal packs. Vegetables Creamed or fried vegetables. Vegetables in a cheese sauce. Regular canned vegetables (not low-sodium or reduced-sodium). Regular canned tomato sauce and paste (not low-sodium or reduced-sodium). Regular tomato and vegetable juice (not low-sodium or reduced-sodium). Pickles. Olives. Fruits Canned fruit in a light or heavy syrup. Fried fruit. Fruit in cream or butter sauce. Meat and other protein foods Fatty cuts of meat. Ribs. Fried meat. Bacon. Sausage. Bologna and other processed lunch meats. Salami. Fatback. Hotdogs. Bratwurst. Salted nuts and seeds. Canned beans with added salt. Canned or smoked fish. Whole eggs or egg yolks. Chicken or turkey with skin. Dairy Whole or 2% milk, cream, and half-and-half. Whole or full-fat cream cheese. Whole-fat or sweetened yogurt. Full-fat cheese. Nondairy creamers. Whipped toppings.  Processed cheese and cheese spreads. Fats and oils Butter. Stick margarine. Lard. Shortening. Ghee. Bacon fat. Tropical oils, such as coconut, palm kernel, or palm oil. Seasoning and other foods Salted popcorn and pretzels. Onion salt, garlic salt, seasoned salt, table salt, and sea salt. Worcestershire sauce. Tartar sauce. Barbecue sauce. Teriyaki sauce. Soy sauce, including reduced-sodium. Steak sauce. Canned and packaged gravies. Fish sauce. Oyster sauce. Cocktail sauce. Horseradish that you find on the shelf. Ketchup. Mustard. Meat flavorings and tenderizers. Bouillon cubes. Hot sauce and Tabasco sauce. Premade or packaged marinades. Premade or packaged taco seasonings. Relishes. Regular salad dressings. Where to find more information:  National Heart, Lung, and Blood Institute: www.nhlbi.nih.gov  American Heart Association: www.heart.org Summary  The DASH eating plan is a healthy eating plan that has been shown to reduce high blood pressure (hypertension). It may also reduce your risk for type 2 diabetes, heart disease, and stroke.  With the DASH eating plan, you should limit salt (sodium) intake to 2,300 mg a day. If you have hypertension, you may need to reduce your sodium intake to 1,500 mg a day.  When on the DASH eating plan, aim to eat more fresh fruits and vegetables, whole grains, lean proteins, low-fat dairy, and heart-healthy fats.  Work with your health care provider or diet and nutrition specialist (dietitian) to adjust your eating plan to your individual   calorie needs. This information is not intended to replace advice given to you by your health care provider. Make sure you discuss any questions you have with your health care provider. Document Released: 08/05/2011 Document Revised: 08/09/2016 Document Reviewed: 08/09/2016 Elsevier Interactive Patient Education  2018 [employer]  

## 2018-01-31 ENCOUNTER — Other Ambulatory Visit: Payer: BC Managed Care – PPO

## 2018-01-31 DIAGNOSIS — E063 Autoimmune thyroiditis: Secondary | ICD-10-CM

## 2018-02-01 LAB — THYROID PEROXIDASE ANTIBODY: THYROID PEROXIDASE ANTIBODY: 4 [IU]/mL (ref <9)

## 2018-02-01 LAB — THYROGLOBULIN ANTIBODY: Thyroglobulin Ab: <1 IU/mL (ref <=1)

## 2018-02-21 ENCOUNTER — Ambulatory Visit (HOSPITAL_BASED_OUTPATIENT_CLINIC_OR_DEPARTMENT_OTHER)
Admission: RE | Admit: 2018-02-21 | Discharge: 2018-02-21 | Disposition: A | Discharge status: Home / Self Care | Payer: BC Managed Care – PPO | Source: Ambulatory Visit | Attending: Family Medicine | Admitting: Family Medicine

## 2018-02-21 ENCOUNTER — Encounter: Payer: Self-pay | Admitting: Obstetrics & Gynecology

## 2018-02-21 DIAGNOSIS — R5383 Other fatigue: Secondary | ICD-10-CM | POA: Insufficient documentation

## 2018-02-21 DIAGNOSIS — I10 Essential (primary) hypertension: Secondary | ICD-10-CM | POA: Diagnosis not present

## 2018-02-21 NOTE — Progress Notes (Signed)
  Echocardiogram 2D Echocardiogram has been performed.  Jacqueline Calhoun 02/21/2018, 10:55 AM

## 2018-02-28 ENCOUNTER — Ambulatory Visit (INDEPENDENT_AMBULATORY_CARE_PROVIDER_SITE_OTHER): Payer: BC Managed Care – PPO

## 2018-02-28 ENCOUNTER — Other Ambulatory Visit: Payer: Self-pay | Admitting: Obstetrics & Gynecology

## 2018-02-28 ENCOUNTER — Ambulatory Visit: Payer: BC Managed Care – PPO | Admitting: Obstetrics & Gynecology

## 2018-02-28 DIAGNOSIS — R9389 Abnormal findings on diagnostic imaging of other specified body structures: Secondary | ICD-10-CM

## 2018-02-28 DIAGNOSIS — N921 Excessive and frequent menstruation with irregular cycle: Secondary | ICD-10-CM | POA: Diagnosis not present

## 2018-02-28 MED ORDER — NORETHIN ACE-ETH ESTRAD-FE 1-20 MG-MCG(24) PO TABS: 1.0000 | ORAL_TABLET | Freq: Every day | ORAL | 4 refills | Status: DC

## 2018-02-28 NOTE — Progress Notes (Signed)
    Lynelle Weiler [birth year] [id number]        42 y.o.  G1P1L1 Married  RP: Thickened Endometrium for Sonohysterogram  HPI: Presented on 01/10/2018 for LLQ pain and BTB at midcycle on LoLoEstrin.  The Pelvic US showed a mildly thickened Endometrial line at 8 mm.  No current vaginal bleeding, no pelvic pain.   OB History  Gravida Para Term Preterm AB Living  1 1     0 1  SAB TAB Ectopic Multiple Live Births      0        # Outcome Date GA Lbr Len/2nd Weight Sex Delivery Anes PTL Lv  1 Para             Past medical history,surgical history, problem list, medications, allergies, family history and social history were all reviewed and documented in the EPIC chart.   Directed ROS with pertinent positives and negatives documented in the history of present illness/assessment and plan.  Exam:  There were no vitals filed for this visit. General appearance:  Normal                                                                    Sono Infusion Hysterogram   The initial transvaginal ultrasound demonstrated the following: T/a images.  Uterus anteverted homogeneous measuring 9.57 cm x 4.91 cm x 4.25 cm.  Endometrium trial layered on day 7 of the cycle measuring 9.2 mm.  Previous thickening of the endometrial line not seen on today's study.  Right and left ovaries normal with an echo-free follicle on the left ovary measuring 2.3 x 2.4 cm.  No apparent mass in the right or left adnexa.  No free fluid in the posterior cul-de-sac.  Given the normal trial layered endometrial line at 9.2 mm, decision to cancel the sonohysterogram.   Assessment/Plan:  42 y.o. G1P0001   1. Breakthrough bleeding on birth control pills Normal trial layered endometrial line on pelvic ultrasound today.  Decision to change birth control pill to improve breakthrough bleeding.  Will increase from a 10 mcg of estradiol to a 20 mcg of estradiol.  Loestrin 24 1/20 FE sent to pharmacy.  Usage risks and benefits reviewed  with patient.  2. Thickened endometrium Normal trial layered endometrial line today.  Patient reassured and sonohysterogram canceled.  Other orders - Norethindrone Acetate-Ethinyl Estrad-FE (LOESTRIN 24 FE) 1-20 MG-MCG(24) tablet; Take 1 tablet by mouth daily.  Counseling on above issues and coordination of care more than 50% for 15 minutes.  Princess Bruins MD, 2:50 PM 02/28/2018

## 2018-03-05 ENCOUNTER — Encounter: Payer: Self-pay | Admitting: Obstetrics & Gynecology

## 2018-03-05 NOTE — Patient Instructions (Signed)
1. Breakthrough bleeding on birth control pills Normal trial layered endometrial line on pelvic ultrasound today.  Decision to change birth control pill to improve breakthrough bleeding.  Will increase from a 10 mcg of estradiol to a 20 mcg of estradiol.  Loestrin 24 1/20 FE sent to pharmacy.  Usage risks and benefits reviewed with patient.  2. Thickened endometrium Normal trial layered endometrial line today.  Patient reassured and sonohysterogram canceled.  Other orders - Norethindrone Acetate-Ethinyl Estrad-FE (LOESTRIN 24 FE) 1-20 MG-MCG(24) tablet; Take 1 tablet by mouth daily.  Jacqueline Calhoun, it was a pleasure seeing you today!

## 2018-05-02 ENCOUNTER — Ambulatory Visit: Payer: BC Managed Care – PPO | Admitting: Family Medicine

## 2018-05-09 ENCOUNTER — Ambulatory Visit: Payer: BC Managed Care – PPO | Admitting: Family Medicine

## 2018-05-09 ENCOUNTER — Encounter: Payer: Self-pay | Admitting: Family Medicine

## 2018-05-09 VITALS — BP 130/90 | HR 68 | Temp 97.9°F | Resp 16 | Ht 65.0 in | Wt 158.0 lb

## 2018-05-09 DIAGNOSIS — Z23 Encounter for immunization: Secondary | ICD-10-CM | POA: Diagnosis not present

## 2018-05-09 DIAGNOSIS — I10 Essential (primary) hypertension: Secondary | ICD-10-CM

## 2018-05-09 MED ORDER — OLMESARTAN MEDOXOMIL 40 MG PO TABS: 40.0000 mg | ORAL_TABLET | Freq: Every day | ORAL | 2 refills | Status: DC

## 2018-05-09 NOTE — Progress Notes (Signed)
Patient ID: Jacqueline Calhoun, female    DOB: 11-06-1975  Age: 42 y.o. MRN: [id number]    Subjective:  Subjective  HPI Pt is here for f/u bp.  No complaints.    Review of Systems  Constitutional: Negative for activity change, appetite change, chills and fever.  HENT: Negative for congestion and hearing loss.   Eyes: Negative for discharge.  Respiratory: Negative for cough and shortness of breath.   Cardiovascular: Negative for chest pain, palpitations and leg swelling.  Gastrointestinal: Negative for abdominal distention, abdominal pain, blood in stool, constipation, diarrhea, nausea and vomiting.  Genitourinary: Negative for difficulty urinating, dyspareunia, dysuria, flank pain, frequency, genital sores, hematuria, menstrual problem, pelvic pain, urgency, vaginal discharge and vaginal pain.  Musculoskeletal: Negative for back pain and myalgias.  Skin: Negative for rash.  Allergic/Immunologic: Negative for environmental allergies.  Neurological: Negative for dizziness, weakness and headaches.  Hematological: Does not bruise/bleed easily.  Psychiatric/Behavioral: Negative for suicidal ideas. The patient is not nervous/anxious.     History Past Medical History:  Diagnosis Date  . Anxiety   . Bladder tumor   . Depression   . Hashimoto's thyroiditis    dx 01/ 2016-- endocrinologist-  dr Cruzita Lederer  . History of ovarian cyst   . Hypertension   . IBS (irritable bowel syndrome)    eat gluten free and take probiotic  . Wears glasses     She has a past surgical history that includes No past surgeries and Transurethral resection of bladder tumor (N/A, 07/12/2016).   Her family history includes Alzheimer's disease in her paternal grandmother; Anxiety disorder in her father; Cancer in her maternal aunt and paternal aunt; Cancer (age of onset: 35) in her paternal grandfather; Heart disease (age of onset: 33) in her maternal grandfather and maternal grandmother; Hypertension in her father and  mother; Pulmonary embolism in her mother; Thyroid disease in her paternal aunt and paternal grandmother.She reports that she has never smoked. She has never used smokeless tobacco. She reports that she drinks alcohol. She reports that she does not use drugs.  Current Outpatient Medications on File Prior to Visit  Medication Sig Dispense Refill  . B Complex-C (B-COMPLEX WITH VITAMIN C) tablet Take 1 tablet by mouth daily.    . Cholecalciferol (VITAMIN D) 2000 UNITS CAPS Take 1 capsule by mouth daily.    . fluticasone (FLONASE) 50 MCG/ACT nasal spray Place 2 sprays into both nostrils daily.    . Ibuprofen (ADVIL) 200 MG CAPS Take by mouth as needed.    . Multiple Vitamin (MULTIVITAMIN) tablet Take 1 tablet by mouth daily.      Marland Kitchen OVER THE COUNTER MEDICATION 1 tablet as needed. GABA supplement    . Probiotic Product (PROBIOTIC ACIDOPHILUS PO) Take 1 tablet by mouth daily.      Marland Kitchen spironolactone (ALDACTONE) 25 MG tablet TAKE 1 TABLET(25 MG) BY MOUTH DAILY 90 tablet 3  . Norethindrone Acetate-Ethinyl Estrad-FE (LOESTRIN 24 FE) 1-20 MG-MCG(24) tablet Take 1 tablet by mouth daily. (Patient not taking: Reported on 05/09/2018) 3 Package 4   No current facility-administered medications on file prior to visit.      Objective:  Objective  Physical Exam  Constitutional: She is oriented to person, place, and time. She appears well-developed and well-nourished.  HENT:  Head: Normocephalic and atraumatic.  Eyes: Conjunctivae and EOM are normal.  Neck: Normal range of motion. Neck supple. No JVD present. Carotid bruit is not present. No thyromegaly present.  Cardiovascular: Normal rate, regular rhythm and normal heart  sounds.  No murmur heard. Pulmonary/Chest: Effort normal and breath sounds normal. No respiratory distress. She has no wheezes. She has no rales. She exhibits no tenderness.  Musculoskeletal: She exhibits no edema.  Neurological: She is alert and oriented to person, place, and time.    Psychiatric: She has a normal mood and affect.  Nursing note and vitals reviewed.  BP 130/90 (BP Location: Left Arm, Patient Position: Sitting, Cuff Size: Normal)   Pulse 68   Temp 97.9 F (36.6 C) (Oral)   Resp 16   Ht 5\' 5"  (1.651 m)   Wt 158 lb (71.7 kg)   SpO2 98%   BMI 26.29 kg/m  Wt Readings from Last 3 Encounters:  05/09/18 158 lb (71.7 kg)  01/30/18 158 lb 12.8 oz (72 kg)  11/01/17 165 lb 6.4 oz (75 kg)     Lab Results  Component Value Date   WBC 7.6 01/06/2017   HGB 14.5 01/06/2017   HCT 43.7 01/06/2017   PLT 304 01/06/2017   GLUCOSE 85 01/30/2018   CHOL 173 01/30/2018   TRIG 124.0 01/30/2018   HDL 50.80 01/30/2018   LDLCALC 97 01/30/2018   ALT 25 01/30/2018   AST 20 01/30/2018   NA 138 01/30/2018   K 4.6 01/30/2018   CL 103 01/30/2018   CREATININE 0.90 01/30/2018   BUN 8 01/30/2018   CO2 28 01/30/2018   TSH 1.05 08/02/2017   INR 1.00 07/12/2016    No results found.   Assessment & Plan:  Plan  I have discontinued Jacqueline Calhoun's losartan. I am also having her start on olmesartan. Additionally, I am having her maintain her Probiotic Product (PROBIOTIC ACIDOPHILUS PO), multivitamin, B-complex with vitamin C, Vitamin D, Ibuprofen, fluticasone, OVER THE COUNTER MEDICATION, spironolactone, and Norethindrone Acetate-Ethinyl Estrad-FE.  Meds ordered this encounter  Medications  . olmesartan (BENICAR) 40 MG tablet    Sig: Take 1 tablet (40 mg total) by mouth daily.    Dispense:  30 tablet    Refill:  2    Problem List Items Addressed This Visit      Unprioritized   Hypertension - Primary    Poorly controlled will alter medications, encouraged DASH diet, minimize caffeine and obtain adequate sleep. Report concerning symptoms and follow up as directed and as needed      Relevant Medications   olmesartan (BENICAR) 40 MG tablet    Other Visit Diagnoses    Need for influenza vaccination          Follow-up: Return in about 3 weeks (around  05/30/2018), or if symptoms worsen or fail to improve, for bp check.  Ann Held, DO

## 2018-05-09 NOTE — Patient Instructions (Signed)
DASH Eating Plan DASH stands for "Dietary Approaches to Stop Hypertension." The DASH eating plan is a healthy eating plan that has been shown to reduce high blood pressure (hypertension). It may also reduce your risk for type 2 diabetes, heart disease, and stroke. The DASH eating plan may also help with weight loss. What are tips for following this plan? General guidelines  Avoid eating more than 2,300 mg (milligrams) of salt (sodium) a day. If you have hypertension, you may need to reduce your sodium intake to 1,500 mg a day.  Limit alcohol intake to no more than 1 drink a day for nonpregnant women and 2 drinks a day for men. One drink equals 12 oz of beer, 5 oz of wine, or 1 oz of hard liquor.  Work with your health care provider to maintain a healthy body weight or to lose weight. Ask what an ideal weight is for you.  Get at least 30 minutes of exercise that causes your heart to beat faster (aerobic exercise) most days of the week. Activities may include walking, swimming, or biking.  Work with your health care provider or diet and nutrition specialist (dietitian) to adjust your eating plan to your individual calorie needs. Reading food labels  Check food labels for the amount of sodium per serving. Choose foods with less than 5 percent of the Daily Value of sodium. Generally, foods with less than 300 mg of sodium per serving fit into this eating plan.  To find whole grains, look for the word "whole" as the first word in the ingredient list. Shopping  Buy products labeled as "low-sodium" or "no salt added."  Buy fresh foods. Avoid canned foods and premade or frozen meals. Cooking  Avoid adding salt when cooking. Use salt-free seasonings or herbs instead of table salt or sea salt. Check with your health care provider or pharmacist before using salt substitutes.  Do not fry foods. Cook foods using healthy methods such as baking, boiling, grilling, and broiling instead.  Cook with  heart-healthy oils, such as olive, canola, soybean, or sunflower oil. Meal planning   Eat a balanced diet that includes: ? 5 or more servings of fruits and vegetables each day. At each meal, try to fill half of your plate with fruits and vegetables. ? Up to 6-8 servings of whole grains each day. ? Less than 6 oz of lean meat, poultry, or fish each day. A 3-oz serving of meat is about the same size as a deck of cards. One egg equals 1 oz. ? 2 servings of low-fat dairy each day. ? A serving of nuts, seeds, or beans 5 times each week. ? Heart-healthy fats. Healthy fats called Omega-3 fatty acids are found in foods such as flaxseeds and coldwater fish, like sardines, salmon, and mackerel.  Limit how much you eat of the following: ? Canned or prepackaged foods. ? Food that is high in trans fat, such as fried foods. ? Food that is high in saturated fat, such as fatty meat. ? Sweets, desserts, sugary drinks, and other foods with added sugar. ? Full-fat dairy products.  Do not salt foods before eating.  Try to eat at least 2 vegetarian meals each week.  Eat more home-cooked food and less restaurant, buffet, and fast food.  When eating at a restaurant, ask that your food be prepared with less salt or no salt, if possible. What foods are recommended? The items listed may not be a complete list. Talk with your dietitian about what   dietary choices are best for you. Grains Whole-grain or whole-wheat bread. Whole-grain or whole-wheat pasta. Brown rice. Oatmeal. Quinoa. Bulgur. Whole-grain and low-sodium cereals. Pita bread. Low-fat, low-sodium crackers. Whole-wheat flour tortillas. Vegetables Fresh or frozen vegetables (raw, steamed, roasted, or grilled). Low-sodium or reduced-sodium tomato and vegetable juice. Low-sodium or reduced-sodium tomato sauce and tomato paste. Low-sodium or reduced-sodium canned vegetables. Fruits All fresh, dried, or frozen fruit. Canned fruit in natural juice (without  added sugar). Meat and other protein foods Skinless chicken or turkey. Ground chicken or turkey. Pork with fat trimmed off. Fish and seafood. Egg whites. Dried beans, peas, or lentils. Unsalted nuts, nut butters, and seeds. Unsalted canned beans. Lean cuts of beef with fat trimmed off. Low-sodium, lean deli meat. Dairy Low-fat (1%) or fat-free (skim) milk. Fat-free, low-fat, or reduced-fat cheeses. Nonfat, low-sodium ricotta or cottage cheese. Low-fat or nonfat yogurt. Low-fat, low-sodium cheese. Fats and oils Soft margarine without trans fats. Vegetable oil. Low-fat, reduced-fat, or light mayonnaise and salad dressings (reduced-sodium). Canola, safflower, olive, soybean, and sunflower oils. Avocado. Seasoning and other foods Herbs. Spices. Seasoning mixes without salt. Unsalted popcorn and pretzels. Fat-free sweets. What foods are not recommended? The items listed may not be a complete list. Talk with your dietitian about what dietary choices are best for you. Grains Baked goods made with fat, such as croissants, muffins, or some breads. Dry pasta or rice meal packs. Vegetables Creamed or fried vegetables. Vegetables in a cheese sauce. Regular canned vegetables (not low-sodium or reduced-sodium). Regular canned tomato sauce and paste (not low-sodium or reduced-sodium). Regular tomato and vegetable juice (not low-sodium or reduced-sodium). Pickles. Olives. Fruits Canned fruit in a light or heavy syrup. Fried fruit. Fruit in cream or butter sauce. Meat and other protein foods Fatty cuts of meat. Ribs. Fried meat. Bacon. Sausage. Bologna and other processed lunch meats. Salami. Fatback. Hotdogs. Bratwurst. Salted nuts and seeds. Canned beans with added salt. Canned or smoked fish. Whole eggs or egg yolks. Chicken or turkey with skin. Dairy Whole or 2% milk, cream, and half-and-half. Whole or full-fat cream cheese. Whole-fat or sweetened yogurt. Full-fat cheese. Nondairy creamers. Whipped toppings.  Processed cheese and cheese spreads. Fats and oils Butter. Stick margarine. Lard. Shortening. Ghee. Bacon fat. Tropical oils, such as coconut, palm kernel, or palm oil. Seasoning and other foods Salted popcorn and pretzels. Onion salt, garlic salt, seasoned salt, table salt, and sea salt. Worcestershire sauce. Tartar sauce. Barbecue sauce. Teriyaki sauce. Soy sauce, including reduced-sodium. Steak sauce. Canned and packaged gravies. Fish sauce. Oyster sauce. Cocktail sauce. Horseradish that you find on the shelf. Ketchup. Mustard. Meat flavorings and tenderizers. Bouillon cubes. Hot sauce and Tabasco sauce. Premade or packaged marinades. Premade or packaged taco seasonings. Relishes. Regular salad dressings. Where to find more information:  National Heart, Lung, and Blood Institute: www.nhlbi.nih.gov  American Heart Association: www.heart.org Summary  The DASH eating plan is a healthy eating plan that has been shown to reduce high blood pressure (hypertension). It may also reduce your risk for type 2 diabetes, heart disease, and stroke.  With the DASH eating plan, you should limit salt (sodium) intake to 2,300 mg a day. If you have hypertension, you may need to reduce your sodium intake to 1,500 mg a day.  When on the DASH eating plan, aim to eat more fresh fruits and vegetables, whole grains, lean proteins, low-fat dairy, and heart-healthy fats.  Work with your health care provider or diet and nutrition specialist (dietitian) to adjust your eating plan to your individual   calorie needs. This information is not intended to replace advice given to you by your health care provider. Make sure you discuss any questions you have with your health care provider. Document Released: 08/05/2011 Document Revised: 08/09/2016 Document Reviewed: 08/09/2016 Elsevier Interactive Patient Education  2018 [employer]  

## 2018-05-09 NOTE — Assessment & Plan Note (Signed)
Poorly controlled will alter medications, encouraged DASH diet, minimize caffeine and obtain adequate sleep. Report concerning symptoms and follow up as directed and as needed 

## 2018-05-10 NOTE — Addendum Note (Signed)
Addended by: Kem Boroughs D on: 05/10/2018 11:58 AM   Modules accepted: Orders

## 2018-05-16 ENCOUNTER — Encounter: Payer: Self-pay | Admitting: Obstetrics & Gynecology

## 2018-05-17 ENCOUNTER — Other Ambulatory Visit: Payer: Self-pay | Admitting: Obstetrics & Gynecology

## 2018-05-17 MED ORDER — NORETHIN-ETH ESTRAD-FE BIPHAS 1 MG-10 MCG / 10 MCG PO TABS: 1.0000 | ORAL_TABLET | Freq: Every day | ORAL | 0 refills | Status: DC

## 2018-05-30 ENCOUNTER — Ambulatory Visit: Payer: BC Managed Care – PPO

## 2018-06-06 ENCOUNTER — Ambulatory Visit (INDEPENDENT_AMBULATORY_CARE_PROVIDER_SITE_OTHER): Payer: BC Managed Care – PPO | Admitting: Family Medicine

## 2018-06-06 VITALS — BP 120/79 | HR 79

## 2018-06-06 DIAGNOSIS — I10 Essential (primary) hypertension: Secondary | ICD-10-CM | POA: Diagnosis not present

## 2018-06-06 NOTE — Progress Notes (Signed)
Reviewed  Jennae Hakeem R Lowne Chase, DO  

## 2018-06-06 NOTE — Progress Notes (Signed)
Pre visit review using our clinic tool,if applicable. No additional management support is needed unless otherwise documented below in the visit note.   Pt here for Blood pressure check per Dr. Roma Schanz dated 05/09/18.  Pt currently takes: Olmesartan 40 mg daily, Spironolactone 25 mg daily  Pt reports compliance with medication.  BP today @ =120/79 HR =79  Pt advised per Dr. Carollee Herter continue medications as ordered and follow up in December. Patient agreed.   Reviewed  Ann Held, DO

## 2018-06-19 ENCOUNTER — Encounter: Payer: BC Managed Care – PPO | Admitting: Obstetrics & Gynecology

## 2018-08-01 ENCOUNTER — Ambulatory Visit: Payer: BC Managed Care – PPO | Admitting: Internal Medicine

## 2018-08-01 ENCOUNTER — Encounter: Payer: Self-pay | Admitting: Internal Medicine

## 2018-08-01 VITALS — BP 118/60 | HR 90 | Ht 65.0 in | Wt 162.0 lb

## 2018-08-01 DIAGNOSIS — E063 Autoimmune thyroiditis: Secondary | ICD-10-CM | POA: Diagnosis not present

## 2018-08-01 LAB — T3, FREE: T3, Free: 3.6 pg/mL (ref 2.3–4.2)

## 2018-08-01 LAB — T4, FREE: FREE T4: 0.6 ng/dL (ref 0.60–1.60)

## 2018-08-01 LAB — TSH: TSH: 1.24 u[IU]/mL (ref 0.35–4.50)

## 2018-08-01 NOTE — Patient Instructions (Signed)
Please stop at the lab.  Please come back for a follow-up appointment in 1 year.  

## 2018-08-01 NOTE — Progress Notes (Signed)
Patient ID: Jacqueline Calhoun, female   DOB: March 06, 1976, 42 y.o.   MRN: [id number]   HPI  Jacqueline Calhoun is a 42 y.o.-year-old female, returning for f/u for euthyroid Hashimoto's thyroiditis. Last visit 1 year ago.  Reviewed and addended history: Pt. has been dx with Hashimoto's thyroiditis in 08/2014 during investigation for several complaints-see below:  She described CP in 2012  >> anxiety, HTN >> started anxiety meds and BP meds, but sxs not better. She also had depression; last year also started to have night sweats, now better. She wakes up early in am, does not sleep well; also has dry skin. Menses: after her baby in 2007 >> menorrhagia >> saw ObGyn >> different images tests >> thick endometrium. In 2014 she had an ovarian cyst >> pain >> started OCPs.   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  At last visit she was describing: Fatigue More anxiety Insomnia Hot flashes at night Dry patches of skin These have all improved. She does complain of hair loss and dry eyes.  Reviewed patient's TFTs: Normal: Lab Results  Component Value Date   TSH 1.05 08/02/2017   TSH 0.90 01/06/2017   TSH 0.76 06/03/2016   TSH 1.03 12/01/2015   TSH 0.79 04/14/2015   TSH 1.07 10/08/2014   TSH 1.353 10/30/2012   TSH 1.099 07/20/2011   FREET4 0.66 08/02/2017   FREET4 0.69 06/03/2016   FREET4 0.69 12/01/2015   FREET4 0.71 04/14/2015   FREET4 0.78 10/08/2014   FREET4 1.13 10/30/2012  04/23/2014 (ObGyn): TSH 0.93, fT4 0.96, fT3 3.2  ATA antibodies have been mildly positive except at last check: Lab Results  Component Value Date   THGAB <1 01/31/2018   THGAB 4 (H) 08/02/2017   THGAB 3 (H) 06/03/2016   THGAB 3 (H) 04/14/2015   THGAB 5 (H) 09/23/2014   Component     Latest Ref Rng & Units 09/23/2014 04/14/2015  Thyroperoxidase Ab SerPl-aCnc     <9 IU/mL 6 6   Pt denies: - feeling nodules in neck - hoarseness - dysphagia - choking - SOB with lying down  She also has a history of HTN and  elevated LFTs >> normalized after starting gluten-free diet 4 years ago.  He continues on his diet.  In 2018 she had resection of a low grade bladder tumor. No extension, no mets.   ROS: Constitutional: no weight gain/no weight loss, + occasional fatigue, no subjective hyperthermia, no subjective hypothermia Eyes: no blurry vision, no xerophthalmia ENT: no sore throat, + see HPI, + dry eyes Cardiovascular: no CP/no SOB/no palpitations/no leg swelling Respiratory: no cough/no SOB/no wheezing Gastrointestinal: no N/no V/no D/no C/+ acid reflux Musculoskeletal: no muscle aches/no joint aches Skin: no rashes, + hair loss Neurological: no tremors/no numbness/no tingling/no dizziness  I reviewed pt's medications, allergies, PMH, social hx, family hx, and changes were documented in the history of present illness. Otherwise, unchanged from my initial visit note.  Past Medical History:  Diagnosis Date  . Anxiety   . Bladder tumor   . Depression   . Hashimoto's thyroiditis    dx 01/ 2016-- endocrinologist-  dr Cruzita Lederer  . History of ovarian cyst   . Hypertension   . IBS (irritable bowel syndrome)    eat gluten free and take probiotic  . Wears glasses    Past Surgical History:  Procedure Laterality Date  . NO PAST SURGERIES    . TRANSURETHRAL RESECTION OF BLADDER TUMOR N/A 07/12/2016   Procedure: TRANSURETHRAL RESECTION OF BLADDER TUMOR (  TURBT);  Surgeon: Nickie Retort, MD;  Location: [facility];  Service: Urology;  Laterality: N/A;     History   Social History  . Marital Status: Married    Spouse Name: N/A    Number of Children: 1   Occupational History  . teacher   Social History Main Topics  . Smoking status: Never Smoker   . Smokeless tobacco: Never Used  . Alcohol Use: 1.2 oz/week    1-2 Shots of liquor per week  . Drug Use: No  . Sexual Activity: Yes    Birth Control/ Protection: Condom   Current Outpatient Medications on File Prior to Visit   Medication Sig Dispense Refill  . B Complex-C (B-COMPLEX WITH VITAMIN C) tablet Take 1 tablet by mouth daily.    . Cholecalciferol (VITAMIN D) 2000 UNITS CAPS Take 1 capsule by mouth daily.    . fluticasone (FLONASE) 50 MCG/ACT nasal spray Place 2 sprays into both nostrils daily.    . Ibuprofen (ADVIL) 200 MG CAPS Take by mouth as needed.    . Multiple Vitamin (MULTIVITAMIN) tablet Take 1 tablet by mouth daily.      . Norethindrone-Ethinyl Estradiol-Fe Biphas (LO LOESTRIN FE) 1 MG-10 MCG / 10 MCG tablet Take 1 tablet by mouth daily. 3 Package 0  . olmesartan (BENICAR) 40 MG tablet Take 1 tablet (40 mg total) by mouth daily. 30 tablet 2  . OVER THE COUNTER MEDICATION 1 tablet as needed. GABA supplement    . Probiotic Product (PROBIOTIC ACIDOPHILUS PO) Take 1 tablet by mouth daily.      Marland Kitchen spironolactone (ALDACTONE) 25 MG tablet TAKE 1 TABLET(25 MG) BY MOUTH DAILY 90 tablet 3   No current facility-administered medications on file prior to visit.    No Known Allergies   Family History  Problem Relation Age of Onset  . Hypertension Mother   . Pulmonary embolism Mother   . Hypertension Father   . Anxiety disorder Father   . Heart disease Maternal Grandmother 42       MI  . Heart disease Maternal Grandfather 79       MI  . Thyroid disease Paternal Aunt   . Cancer Paternal Aunt        breast  . Cancer Maternal Aunt        colon  . Alzheimer's disease Paternal Grandmother   . Thyroid disease Paternal Grandmother   . Cancer Paternal Grandfather 76    PE: BP 118/60   Pulse 90   Ht 5\' 5"  (1.651 m) Comment: measured  Wt 162 lb (73.5 kg)   SpO2 98%   BMI 26.96 kg/m  Body mass index is 26.96 kg/m. Wt Readings from Last 3 Encounters:  08/01/18 162 lb (73.5 kg)  05/09/18 158 lb (71.7 kg)  01/30/18 158 lb 12.8 oz (72 kg)   Constitutional: slightly overweight, in NAD Eyes: PERRLA, EOMI, no exophthalmos ENT: moist mucous membranes, no thyromegaly, no cervical  lymphadenopathy Cardiovascular: RRR, No MRG Respiratory: CTA B Gastrointestinal: abdomen soft, NT, ND, BS+ Musculoskeletal: no deformities, strength intact in all 4 Skin: moist, warm, no rashes Neurological: no tremor with outstretched hands, DTR normal in all 4  ASSESSMENT: 1. Hashimoto thyroiditis - normal TPO antibodies -Slightly high antithyroglobulin antibodies  PLAN: 1. Hashimoto thyroiditis - euthyroid - pt started to feel better after she started to improve her diet and started exercise. At last visit, she was still eating a gluten-free diet. She still had some fatigue, but overall,  she was feeling better - We reviewed together her previous thyroid test-continue to remain normal and also her antibody levels.  At last check, her ATA antibodies returned normal, but these have been previously elevated.  She does have a diagnosis of Hashimoto's thyroiditis based on the elevation of these antibodies.  - We again discussed that the treatment of Hashimoto's thyroiditis is limited to thyroid hormones in case her TFTs return abnormal.  At last visit, she was wondering whether a low-dose thyroid hormone would improve her symptoms but we discussed about possible side effects including increasing blood pressure.  She was just diagnosed with blood pressure before last visit and started on medication.  She continues on this.  Blood pressure today is excellent. - At last visit, we will check again her TFTs + ATA (she would like this check at every visit) - We discussed the need for ADA increase significantly, selenium supplementation is an option - I will see her back in a year, but we did discuss that if she prefers to follow with her PCP yearly and then return if labs are abnormal, this is also an option.  For now, I will schedule her to come back in a year.  - time spent with the patient: 15 minutes, of which >50% was spent in obtaining information about her symptoms, reviewing her previous labs,  evaluations, and treatments, counseling her about her condition (please see the discussed topics above), and developing a plan to further investigate and treat it; she had a number of questions which I addressed.  Component     Latest Ref Rng & Units 08/01/2018  Thyroglobulin Ab     < or = 1 IU/mL <1  TSH     0.35 - 4.50 uIU/mL 1.24  T4,Free(Direct)     0.60 - 1.60 ng/dL 0.60  Triiodothyronine,Free,Serum     2.3 - 4.2 pg/mL 3.6   Normal labs.  Philemon Kingdom, MD PhD [facility] Endocrinology

## 2018-08-02 LAB — THYROGLOBULIN ANTIBODY: Thyroglobulin Ab: <1 IU/mL (ref <=1)

## 2018-08-06 ENCOUNTER — Other Ambulatory Visit: Payer: Self-pay

## 2018-08-07 MED ORDER — NORETHIN-ETH ESTRAD-FE BIPHAS 1 MG-10 MCG / 10 MCG PO TABS: 1.0000 | ORAL_TABLET | Freq: Every day | ORAL | 0 refills | Status: DC

## 2018-08-13 ENCOUNTER — Other Ambulatory Visit: Payer: Self-pay | Admitting: Family Medicine

## 2018-08-13 DIAGNOSIS — I10 Essential (primary) hypertension: Secondary | ICD-10-CM

## 2018-09-20 ENCOUNTER — Encounter: Payer: Self-pay | Admitting: Obstetrics & Gynecology

## 2018-09-20 ENCOUNTER — Ambulatory Visit: Payer: BC Managed Care – PPO | Admitting: Obstetrics & Gynecology

## 2018-09-20 VITALS — BP 128/88 | Ht 64.5 in | Wt 164.0 lb

## 2018-09-20 DIAGNOSIS — Z01419 Encounter for gynecological examination (general) (routine) without abnormal findings: Secondary | ICD-10-CM | POA: Diagnosis not present

## 2018-09-20 DIAGNOSIS — E663 Overweight: Secondary | ICD-10-CM | POA: Diagnosis not present

## 2018-09-20 DIAGNOSIS — Z3041 Encounter for surveillance of contraceptive pills: Secondary | ICD-10-CM

## 2018-09-20 MED ORDER — NORGESTIM-ETH ESTRAD TRIPHASIC 0.18/0.215/0.25 MG-25 MCG PO TABS: 1.0000 | ORAL_TABLET | Freq: Every day | ORAL | 4 refills | Status: DC

## 2018-09-20 NOTE — Patient Instructions (Signed)
1. Encounter for routine gynecological examination with Papanicolaou smear of cervix Normal gynecologic exam.  Pap reflex done.  Breast exam normal.  Last screening mammogram September 2019 was negative.  We will follow-up here for fasting health labs. - Pap IG w/ reflex to HPV when ASC-U - CBC; Future - Comp Met (CMET); Future - Lipid panel; Future - VITAMIN D 25 Hydroxy (Vit-D Deficiency, Fractures); Future - TSH; Future  2. Encounter for surveillance of contraceptive pills BTB on Lo Lo-Estrin.  Decision to change to Tri-Sprintec Lo.  No contraindication to birth control pills.  Prescription sent to pharmacy.  3. Overweight (BMI 25.0-29.9) Recommend lower calorie/carb diet such as [employer].  Aerobic physical activities 5 times a week and weightlifting every 2 days.  Other orders - Norgestimate-Ethinyl Estradiol Triphasic 0.18/0.215/0.25 MG-25 MCG tab; Take 1 tablet by mouth daily.  Jacqueline Calhoun, it was a pleasure seeing you today!  I will inform you of your results as soon as they are available.

## 2018-09-20 NOTE — Progress Notes (Signed)
Jacqueline Calhoun 1976/01/02 [id number]   History:    43 y.o. G1P1L1 Married.  Daughter is 27 yo.  RP:  Established patient presenting for annual gyn exam   HPI: On Lo Lo-Estrin with BTB and occasional low mood.  Would like to change BCPs.  No pelvic pain currently.  No pain with IC.  Normal vaginal secretions.  Breasts normal.  Urine/BMs wnl.  Will f/u here for fasting Health Labs.  Past medical history,surgical history, family history and social history were all reviewed and documented in the EPIC chart.  Gynecologic History Patient's last menstrual period was 09/08/2018. Contraception: OCP (estrogen/progesterone) Last Pap: 12/2016. Results were: Negative Last mammogram: 04/2018. Results were: Negative Bone Density: Never Colonoscopy: Never  Obstetric History OB History  Gravida Para Term Preterm AB Living  1 1     0 1  SAB TAB Ectopic Multiple Live Births      0        # Outcome Date GA Lbr Len/2nd Weight Sex Delivery Anes PTL Lv  1 Para              ROS: A ROS was performed and pertinent positives and negatives are included in the history.  GENERAL: No fevers or chills. HEENT: No change in vision, no earache, sore throat or sinus congestion. NECK: No pain or stiffness. CARDIOVASCULAR: No chest pain or pressure. No palpitations. PULMONARY: No shortness of breath, cough or wheeze. GASTROINTESTINAL: No abdominal pain, nausea, vomiting or diarrhea, melena or bright red blood per rectum. GENITOURINARY: No urinary frequency, urgency, hesitancy or dysuria. MUSCULOSKELETAL: No joint or muscle pain, no back pain, no recent trauma. DERMATOLOGIC: No rash, no itching, no lesions. ENDOCRINE: No polyuria, polydipsia, no heat or cold intolerance. No recent change in weight. HEMATOLOGICAL: No anemia or easy bruising or bleeding. NEUROLOGIC: No headache, seizures, numbness, tingling or weakness. PSYCHIATRIC: No depression, no loss of interest in normal activity or change in sleep pattern.      Exam:   BP 128/88   Ht 5' 4.5" (1.638 m)   Wt 164 lb (74.4 kg)   LMP 09/08/2018 Comment: LOLOESTRIN   BMI 27.72 kg/m   Body mass index is 27.72 kg/m.  General appearance : Well developed well nourished female. No acute distress HEENT: Eyes: no retinal hemorrhage or exudates,  Neck supple, trachea midline, no carotid bruits, no thyroidmegaly Lungs: Clear to auscultation, no rhonchi or wheezes, or rib retractions  Heart: Regular rate and rhythm, no murmurs or gallops Breast:Examined in sitting and supine position were symmetrical in appearance, no palpable masses or tenderness,  no skin retraction, no nipple inversion, no nipple discharge, no skin discoloration, no axillary or supraclavicular lymphadenopathy Abdomen: no palpable masses or tenderness, no rebound or guarding Extremities: no edema or skin discoloration or tenderness  Pelvic: Vulva: Normal             Vagina: No gross lesions or discharge  Cervix: No gross lesions or discharge.  Pap reflex done  Uterus  AV, normal size, shape and consistency, non-tender and mobile  Adnexa  Without masses or tenderness  Anus: Normal   Assessment/Plan:  43 y.o. female for annual exam   1. Encounter for routine gynecological examination with Papanicolaou smear of cervix Normal gynecologic exam.  Pap reflex done.  Breast exam normal.  Last screening mammogram September 2019 was negative.  We will follow-up here for fasting health labs. - Pap IG w/ reflex to HPV when ASC-U - CBC; Future - Comp Met (  CMET); Future - Lipid panel; Future - VITAMIN D 25 Hydroxy (Vit-D Deficiency, Fractures); Future - TSH; Future  2. Encounter for surveillance of contraceptive pills BTB on Lo Lo-Estrin.  Decision to change to Tri-Sprintec Lo.  No contraindication to birth control pills.  Prescription sent to pharmacy.  3. Overweight (BMI 25.0-29.9) Recommend lower calorie/carb diet such as [employer].  Aerobic physical activities 5 times a week  and weightlifting every 2 days.  Other orders - Norgestimate-Ethinyl Estradiol Triphasic 0.18/0.215/0.25 MG-25 MCG tab; Take 1 tablet by mouth daily.  Princess Bruins MD, 12:21 PM 09/20/2018

## 2018-09-21 LAB — PAP IG W/ RFLX HPV ASCU

## 2018-09-26 ENCOUNTER — Other Ambulatory Visit: Payer: BC Managed Care – PPO

## 2018-09-26 DIAGNOSIS — Z01419 Encounter for gynecological examination (general) (routine) without abnormal findings: Secondary | ICD-10-CM

## 2018-09-27 LAB — COMPREHENSIVE METABOLIC PANEL
AG RATIO: 1.6 (calc) (ref 1.0–2.5)
ALT: 25 U/L (ref 6–29)
AST: 21 U/L (ref 10–30)
Albumin: 4 g/dL (ref 3.6–5.1)
Alkaline phosphatase (APISO): 46 U/L (ref 33–115)
BUN: 12 mg/dL (ref 7–25)
CHLORIDE: 105 mmol/L (ref 98–110)
CO2: 25 mmol/L (ref 20–32)
Calcium: 9.1 mg/dL (ref 8.6–10.2)
Creat: 0.9 mg/dL (ref 0.50–1.10)
GLUCOSE: 91 mg/dL (ref 65–99)
Globulin: 2.5 g/dL (calc) (ref 1.9–3.7)
POTASSIUM: 4.5 mmol/L (ref 3.5–5.3)
SODIUM: 139 mmol/L (ref 135–146)
Total Bilirubin: 0.5 mg/dL (ref 0.2–1.2)
Total Protein: 6.5 g/dL (ref 6.1–8.1)

## 2018-09-27 LAB — CBC
HCT: 42.7 % (ref 35.0–45.0)
HEMOGLOBIN: 14.2 g/dL (ref 11.7–15.5)
MCH: 28.6 pg (ref 27.0–33.0)
MCHC: 33.3 g/dL (ref 32.0–36.0)
MCV: 85.9 fL (ref 80.0–100.0)
MPV: 9.8 fL (ref 7.5–12.5)
PLATELETS: 341 10*3/uL (ref 140–400)
RBC: 4.97 10*6/uL (ref 3.80–5.10)
RDW: 11.9 % (ref 11.0–15.0)
WBC: 8 10*3/uL (ref 3.8–10.8)

## 2018-09-27 LAB — LIPID PANEL
CHOL/HDL RATIO: 2.9 (calc) (ref <5.0)
CHOLESTEROL: 176 mg/dL (ref <200)
HDL: 60 mg/dL (ref >50)
LDL CHOLESTEROL (CALC): 94 mg/dL
Non-HDL Cholesterol (Calc): 116 mg/dL (calc) (ref <130)
TRIGLYCERIDES: 122 mg/dL (ref <150)

## 2018-09-27 LAB — VITAMIN D 25 HYDROXY (VIT D DEFICIENCY, FRACTURES): VIT D 25 HYDROXY: 35 ng/mL (ref 30–100)

## 2018-09-27 LAB — TSH: TSH: 1.1 m[IU]/L

## 2018-09-29 ENCOUNTER — Encounter: Payer: Self-pay | Admitting: *Deleted

## 2019-01-08 ENCOUNTER — Encounter: Payer: Self-pay | Admitting: Family Medicine

## 2019-01-08 ENCOUNTER — Other Ambulatory Visit: Payer: Self-pay | Admitting: Family Medicine

## 2019-01-08 DIAGNOSIS — K219 Gastro-esophageal reflux disease without esophagitis: Secondary | ICD-10-CM

## 2019-01-08 MED ORDER — OMEPRAZOLE 20 MG PO CPDR: 20.0000 mg | DELAYED_RELEASE_CAPSULE | Freq: Every day | ORAL | 3 refills | Status: DC

## 2019-01-08 NOTE — Telephone Encounter (Signed)
I sent omeprazole ---- if food is getting stuck she may need GI -- she may have a stricture Ok to put GI referral if pt agrees

## 2019-02-12 ENCOUNTER — Other Ambulatory Visit: Payer: Self-pay | Admitting: Family Medicine

## 2019-02-12 DIAGNOSIS — I10 Essential (primary) hypertension: Secondary | ICD-10-CM

## 2019-04-06 ENCOUNTER — Other Ambulatory Visit: Payer: Self-pay | Admitting: Family Medicine

## 2019-04-06 DIAGNOSIS — I10 Essential (primary) hypertension: Secondary | ICD-10-CM

## 2019-04-20 ENCOUNTER — Encounter: Payer: Self-pay | Admitting: Family Medicine

## 2019-04-20 DIAGNOSIS — I10 Essential (primary) hypertension: Secondary | ICD-10-CM

## 2019-04-20 MED ORDER — OLMESARTAN MEDOXOMIL 40 MG PO TABS: 40.0000 mg | ORAL_TABLET | Freq: Every day | ORAL | 1 refills | Status: DC

## 2019-05-15 ENCOUNTER — Ambulatory Visit: Payer: BC Managed Care – PPO | Admitting: Family Medicine

## 2019-05-15 ENCOUNTER — Other Ambulatory Visit: Payer: Self-pay

## 2019-05-15 ENCOUNTER — Encounter: Payer: Self-pay | Admitting: Family Medicine

## 2019-05-15 VITALS — BP 120/88 | HR 87 | Temp 97.0°F | Resp 18 | Ht 64.5 in | Wt 166.8 lb

## 2019-05-15 DIAGNOSIS — K219 Gastro-esophageal reflux disease without esophagitis: Secondary | ICD-10-CM | POA: Diagnosis not present

## 2019-05-15 DIAGNOSIS — Z23 Encounter for immunization: Secondary | ICD-10-CM

## 2019-05-15 DIAGNOSIS — I10 Essential (primary) hypertension: Secondary | ICD-10-CM | POA: Diagnosis not present

## 2019-05-15 LAB — COMPREHENSIVE METABOLIC PANEL
ALT: 48 U/L — ABNORMAL HIGH (ref 0–35)
AST: 41 U/L — ABNORMAL HIGH (ref 0–37)
Albumin: 4.1 g/dL (ref 3.5–5.2)
Alkaline Phosphatase: 56 U/L (ref 39–117)
BUN: 14 mg/dL (ref 6–23)
CO2: 28 mEq/L (ref 19–32)
Calcium: 9.5 mg/dL (ref 8.4–10.5)
Chloride: 105 mEq/L (ref 96–112)
Creatinine, Ser: 0.92 mg/dL (ref 0.40–1.20)
GFR: 65.97 mL/min (ref >60.00)
Glucose, Bld: 87 mg/dL (ref 70–99)
Potassium: 4.8 mEq/L (ref 3.5–5.1)
Sodium: 140 mEq/L (ref 135–145)
Total Bilirubin: 0.6 mg/dL (ref 0.2–1.2)
Total Protein: 6.7 g/dL (ref 6.0–8.3)

## 2019-05-15 LAB — H. PYLORI ANTIBODY, IGG: H Pylori IgG: NEGATIVE

## 2019-05-15 LAB — CBC WITH DIFFERENTIAL/PLATELET
Basophils Absolute: 0.1 10*3/uL (ref 0.0–0.1)
Basophils Relative: 1.1 % (ref 0.0–3.0)
Eosinophils Absolute: 0.2 10*3/uL (ref 0.0–0.7)
Eosinophils Relative: 2.5 % (ref 0.0–5.0)
HCT: 43.8 % (ref 36.0–46.0)
Hemoglobin: 14.5 g/dL (ref 12.0–15.0)
Lymphocytes Relative: 21.9 % (ref 12.0–46.0)
Lymphs Abs: 1.7 10*3/uL (ref 0.7–4.0)
MCHC: 33.1 g/dL (ref 30.0–36.0)
MCV: 87.6 fl (ref 78.0–100.0)
Monocytes Absolute: 0.7 10*3/uL (ref 0.1–1.0)
Monocytes Relative: 9.3 % (ref 3.0–12.0)
Neutro Abs: 5 10*3/uL (ref 1.4–7.7)
Neutrophils Relative %: 65.2 % (ref 43.0–77.0)
Platelets: 311 10*3/uL (ref 150.0–400.0)
RBC: 5 Mil/uL (ref 3.87–5.11)
RDW: 13.3 % (ref 11.5–15.5)
WBC: 7.6 10*3/uL (ref 4.0–10.5)

## 2019-05-15 MED ORDER — OMEPRAZOLE 20 MG PO CPDR: 20.0000 mg | DELAYED_RELEASE_CAPSULE | Freq: Every day | ORAL | 3 refills | Status: DC

## 2019-05-15 MED ORDER — OLMESARTAN MEDOXOMIL 40 MG PO TABS: 40.0000 mg | ORAL_TABLET | Freq: Every day | ORAL | 1 refills | Status: DC

## 2019-05-15 NOTE — Progress Notes (Signed)
Patient ID: Jacqueline Calhoun, female    DOB: 07/20/1976  Age: 43 y.o. MRN: [id number]    Subjective:  Subjective  HPI Jacqueline Calhoun presents for f/u bp.  She also c/o worsening gerd symptoms even with meds. No other complaints.   Review of Systems  Constitutional: Negative for appetite change, diaphoresis, fatigue and unexpected weight change.  Eyes: Negative for pain, redness and visual disturbance.  Respiratory: Negative for cough, chest tightness, shortness of breath and wheezing.   Cardiovascular: Negative for chest pain, palpitations and leg swelling.  Gastrointestinal: Negative for abdominal distention and abdominal pain.  Endocrine: Negative for cold intolerance, heat intolerance, polydipsia, polyphagia and polyuria.  Genitourinary: Negative for difficulty urinating, dysuria and frequency.  Neurological: Negative for dizziness, light-headedness, numbness and headaches.    History Past Medical History:  Diagnosis Date  . Anxiety   . Bladder tumor   . Depression   . Hashimoto's thyroiditis    dx 01/ 2016-- endocrinologist-  dr Cruzita Lederer  . History of ovarian cyst   . Hypertension   . IBS (irritable bowel syndrome)    eat gluten free and take probiotic  . Wears glasses     She has a past surgical history that includes No past surgeries and Transurethral resection of bladder tumor (N/A, 07/12/2016).   Her family history includes Alzheimer's disease in her paternal grandmother; Anxiety disorder in her father; Cancer in her maternal aunt and paternal aunt; Cancer (age of onset: 75) in her paternal grandfather; Heart disease (age of onset: 48) in her maternal grandfather and maternal grandmother; Hypertension in her father and mother; Pulmonary embolism in her mother; Thyroid disease in her paternal aunt and paternal grandmother.She reports that she has never smoked. She has never used smokeless tobacco. She reports current alcohol use. She reports that she does not use drugs.  Current  Outpatient Medications on File Prior to Visit  Medication Sig Dispense Refill  . B Complex-C (B-COMPLEX WITH VITAMIN C) tablet Take 1 tablet by mouth daily.    . Cholecalciferol (VITAMIN D) 2000 UNITS CAPS Take 1 capsule by mouth daily.    . fluticasone (FLONASE) 50 MCG/ACT nasal spray Place 2 sprays into both nostrils daily.    . Ibuprofen (ADVIL) 200 MG CAPS Take by mouth as needed.    . Multiple Vitamin (MULTIVITAMIN) tablet Take 1 tablet by mouth daily.      . Norgestimate-Ethinyl Estradiol Triphasic 0.18/0.215/0.25 MG-25 MCG tab Take 1 tablet by mouth daily. 3 Package 4  . OVER THE COUNTER MEDICATION 1 tablet as needed. GABA supplement    . Probiotic Product (PROBIOTIC ACIDOPHILUS PO) Take 1 tablet by mouth daily.      Marland Kitchen spironolactone (ALDACTONE) 25 MG tablet TAKE 1 TABLET(25 MG) BY MOUTH DAILY 30 tablet 0   No current facility-administered medications on file prior to visit.      Objective:  Objective  Physical Exam Vitals signs and nursing note reviewed.  Constitutional:      Appearance: She is well-developed.  HENT:     Head: Normocephalic and atraumatic.  Eyes:     Conjunctiva/sclera: Conjunctivae normal.  Neck:     Musculoskeletal: Normal range of motion and neck supple.     Thyroid: No thyromegaly.     Vascular: No carotid bruit or JVD.  Cardiovascular:     Rate and Rhythm: Normal rate and regular rhythm.     Heart sounds: Normal heart sounds. No murmur.  Pulmonary:     Effort: Pulmonary effort is normal. No  respiratory distress.     Breath sounds: Normal breath sounds. No wheezing or rales.  Chest:     Chest wall: No tenderness.  Abdominal:     General: There is no distension.     Tenderness: There is no abdominal tenderness. There is no guarding or rebound.  Neurological:     Mental Status: She is alert and oriented to person, place, and time.    BP 120/88 (BP Location: Left Arm, Patient Position: Sitting, Cuff Size: Normal)   Pulse 87   Temp (!) 97 F  (36.1 C) (Temporal)   Resp 18   Ht 5' 4.5" (1.638 m)   Wt 166 lb 12.8 oz (75.7 kg)   SpO2 98%   BMI 28.19 kg/m  Wt Readings from Last 3 Encounters:  05/15/19 166 lb 12.8 oz (75.7 kg)  09/20/18 164 lb (74.4 kg)  08/01/18 162 lb (73.5 kg)     Lab Results  Component Value Date   WBC 8.0 09/26/2018   HGB 14.2 09/26/2018   HCT 42.7 09/26/2018   PLT 341 09/26/2018   GLUCOSE 91 09/26/2018   CHOL 176 09/26/2018   TRIG 122 09/26/2018   HDL 60 09/26/2018   LDLCALC 94 09/26/2018   ALT 25 09/26/2018   AST 21 09/26/2018   NA 139 09/26/2018   K 4.5 09/26/2018   CL 105 09/26/2018   CREATININE 0.90 09/26/2018   BUN 12 09/26/2018   CO2 25 09/26/2018   TSH 1.10 09/26/2018   INR 1.00 07/12/2016    No results found.   Assessment & Plan:  Plan  I am having Jacqueline Calhoun maintain her Probiotic Product (PROBIOTIC ACIDOPHILUS PO), multivitamin, B-complex with vitamin C, Vitamin D, Ibuprofen, fluticasone, OVER THE COUNTER MEDICATION, Norgestimate-Ethinyl Estradiol Triphasic, spironolactone, omeprazole, and olmesartan.  Meds ordered this encounter  Medications  . omeprazole (PRILOSEC) 20 MG capsule    Sig: Take 1 capsule (20 mg total) by mouth daily.    Dispense:  30 capsule    Refill:  3  . olmesartan (BENICAR) 40 MG tablet    Sig: Take 1 tablet (40 mg total) by mouth daily.    Dispense:  90 tablet    Refill:  1    Requested drug refills are authorized, however, the patient needs further evaluation and/or laboratory testing before further refills are given. Ask her to make an appointment for this.    Problem List Items Addressed This Visit      Unprioritized   Gastroesophageal reflux disease    con't omeprazole -- symptoms worsening despite meds  Refer to GI and check labs       Relevant Medications   omeprazole (PRILOSEC) 20 MG capsule   Other Relevant Orders   Ambulatory referral to Gastroenterology   H. pylori antibody, IgG   CBC with Differential/Platelet    Comprehensive metabolic panel   Hypertension - Primary    Well controlled, no changes to meds. Encouraged heart healthy diet such as the DASH diet and exercise as tolerated.       Relevant Medications   olmesartan (BENICAR) 40 MG tablet   Other Relevant Orders   CBC with Differential/Platelet   Comprehensive metabolic panel    Other Visit Diagnoses    Need for influenza vaccination       Relevant Orders   Flu Vaccine QUAD 6+ mos PF IM (Fluarix Quad PF) (Completed)      Follow-up: Return in about 6 months (around 11/12/2019), or if symptoms worsen or fail to  improve, for hypertension, annual exam, fasting.  Ann Held, DO      1

## 2019-05-15 NOTE — Patient Instructions (Signed)

## 2019-05-15 NOTE — Assessment & Plan Note (Signed)
Well controlled, no changes to meds. Encouraged heart healthy diet such as the DASH diet and exercise as tolerated.  °

## 2019-05-15 NOTE — Assessment & Plan Note (Signed)
con't omeprazole -- symptoms worsening despite meds  Refer to GI and check labs

## 2019-06-22 MED ORDER — NORETHIN-ETH ESTRAD-FE BIPHAS 1 MG-10 MCG / 10 MCG PO TABS: 1.0000 | ORAL_TABLET | Freq: Every day | ORAL | 11 refills | Status: DC

## 2019-07-10 ENCOUNTER — Encounter: Payer: Self-pay | Admitting: Obstetrics & Gynecology

## 2019-07-20 ENCOUNTER — Encounter: Payer: Self-pay | Admitting: Family Medicine

## 2019-09-10 ENCOUNTER — Ambulatory Visit: Payer: BC Managed Care – PPO | Admitting: Internal Medicine

## 2019-09-10 ENCOUNTER — Other Ambulatory Visit: Payer: Self-pay

## 2019-09-10 ENCOUNTER — Encounter: Payer: Self-pay | Admitting: Internal Medicine

## 2019-09-10 VITALS — BP 130/80 | HR 80 | Ht 64.5 in | Wt 171.0 lb

## 2019-09-10 DIAGNOSIS — E063 Autoimmune thyroiditis: Secondary | ICD-10-CM | POA: Diagnosis not present

## 2019-09-10 NOTE — Progress Notes (Signed)
Patient ID: Jacqueline Calhoun, female   DOB: 02-Aug-1976, 44 y.o.   MRN: [id number]  This visit occurred during the SARS-CoV-2 public health emergency.  Safety protocols were in place, including screening questions prior to the visit, additional usage of staff PPE, and extensive cleaning of exam room while observing appropriate contact time as indicated for disinfecting solutions.   HPI  Jacqueline Calhoun is a 44 y.o.-year-old female, returning for f/u for euthyroid Hashimoto's thyroiditis.-year-old female, returning for f/u for euthyroid Hashimoto's thyroiditis. Last visit 1 year and 1 month ago.   She was working from home during the coronavirus pandemic, now works both from home and goes to school in person.  Reviewed and addended history: Pt. has been dx with Hashimoto's thyroiditis in 08/2014 during investigation for several complaints-see below:  She described CP in 2012  >> anxiety, HTN >> started anxiety meds and BP meds, but sxs not better. She also had depression; last year also started to have night sweats, now better. She wakes up early in am, does not sleep well; also has dry skin. Menses: after her baby in 2007 >> menorrhagia >> saw ObGyn >> different images tests >> thick endometrium. In 2014 she had an ovarian cyst >> pain >> started OCPs.   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  When I first saw the patient, she was describing: Fatigue More anxiety Insomnia Hot flashes at night Dry patches of skin These have improved.  She only complains of hair loss and dry eyes, which are chronic.  TFTs were reviewed and they were normal Lab Results  Component Value Date   TSH 1.10 09/26/2018   TSH 1.24 08/01/2018   TSH 1.05 08/02/2017   TSH 0.90 01/06/2017   TSH 0.76 06/03/2016   TSH 1.03 12/01/2015   TSH 0.79 04/14/2015   TSH 1.07 10/08/2014   TSH 1.353 10/30/2012   TSH 1.099 07/20/2011   FREET4 0.60 08/01/2018   FREET4 0.66 08/02/2017   FREET4 0.69 06/03/2016   FREET4 0.69 12/01/2015   FREET4 0.71 04/14/2015   FREET4 0.78 10/08/2014   FREET4 1.13  10/30/2012  04/23/2014 (ObGyn): TSH 0.93, fT4 0.96, fT3 3.2  ATA antibodies normalized: Lab Results  Component Value Date   THGAB <1 08/01/2018   THGAB <1 01/31/2018   THGAB 4 (H) 08/02/2017   THGAB 3 (H) 06/03/2016   THGAB 3 (H) 04/14/2015   THGAB 5 (H) 09/23/2014   TPO antibodies were normal Component     Latest Ref Rng & Units 09/23/2014 04/14/2015  Thyroperoxidase Ab SerPl-aCnc     <9 IU/mL 6 6   Pt denies: - feeling nodules in neck - hoarseness - dysphagia - choking - SOB with lying down  She also has a history of HTN and elevated LFTs >> normalized after starting gluten-free diet 5 years ago.  She continues on his diet.  However, and 05/2019, LFTs were slightly higher again.  She is not sure why they increase.  In 2018 she had resection of a low grade bladder tumor. No extension, no mets.   Started Omeprazole since last visit.  ROS: Constitutional: + weight gain/no weight loss, no fatigue, no subjective hyperthermia, no subjective hypothermia Eyes: no blurry vision, + xerophthalmia ENT: no sore throat, + see HPI Cardiovascular: no CP/no SOB/no palpitations/no leg swelling Respiratory: no cough/no SOB/no wheezing Gastrointestinal: no N/no V/no D/no C/+ acid reflux Musculoskeletal: no muscle aches/no joint aches Skin: no rashes, + hair loss Neurological: no tremors/no numbness/no tingling/no dizziness  I reviewed pt's medications, allergies, PMH, social hx, family hx,  and changes were documented in the history of present illness. Otherwise, unchanged from my initial visit note.  Past Medical History:  Diagnosis Date  . Anxiety   . Bladder tumor   . Depression   . Hashimoto's thyroiditis    dx 01/ 2016-- endocrinologist-  dr Cruzita Lederer  . History of ovarian cyst   . Hypertension   . IBS (irritable bowel syndrome)    eat gluten free and take probiotic  . Wears glasses    Past Surgical History:  Procedure Laterality Date  . NO PAST SURGERIES    . TRANSURETHRAL  RESECTION OF BLADDER TUMOR N/A 07/12/2016   Procedure: TRANSURETHRAL RESECTION OF BLADDER TUMOR (TURBT);  Surgeon: Nickie Retort, MD;  Location: [facility];  Service: Urology;  Laterality: N/A;     History   Social History  . Marital Status: Married    Spouse Name: N/A    Number of Children: 1   Occupational History  . teacher   Social History Main Topics  . Smoking status: Never Smoker   . Smokeless tobacco: Never Used  . Alcohol Use: 1.2 oz/week    1-2 Shots of liquor per week  . Drug Use: No  . Sexual Activity: Yes    Birth Control/ Protection: Condom   Current Outpatient Medications on File Prior to Visit  Medication Sig Dispense Refill  . fluticasone (FLONASE) 50 MCG/ACT nasal spray Place 2 sprays into both nostrils daily.    . Ibuprofen (ADVIL) 200 MG CAPS Take by mouth as needed.    . Multiple Vitamin (MULTIVITAMIN) tablet Take 1 tablet by mouth daily.      . Norethindrone-Ethinyl Estradiol-Fe Biphas (LO LOESTRIN FE) 1 MG-10 MCG / 10 MCG tablet Take 1 tablet by mouth daily. 1 Package 11  . olmesartan (BENICAR) 40 MG tablet Take 1 tablet (40 mg total) by mouth daily. 90 tablet 1  . omeprazole (PRILOSEC) 20 MG capsule Take 1 capsule (20 mg total) by mouth daily. 30 capsule 3  . OVER THE COUNTER MEDICATION 1 tablet as needed. GABA supplement    . Probiotic Product (PROBIOTIC ACIDOPHILUS PO) Take 1 tablet by mouth daily.      Marland Kitchen spironolactone (ALDACTONE) 25 MG tablet TAKE 1 TABLET(25 MG) BY MOUTH DAILY 30 tablet 0   No current facility-administered medications on file prior to visit.   No Known Allergies   Family History  Problem Relation Age of Onset  . Hypertension Mother   . Pulmonary embolism Mother   . Hypertension Father   . Anxiety disorder Father   . Heart disease Maternal Grandmother 5       MI  . Heart disease Maternal Grandfather 52       MI  . Thyroid disease Paternal Aunt   . Cancer Paternal Aunt        breast  . Cancer  Maternal Aunt        colon  . Alzheimer's disease Paternal Grandmother   . Thyroid disease Paternal Grandmother   . Cancer Paternal Grandfather 26    PE: BP 130/80   Pulse 80   Ht 5' 4.5" (1.638 m)   Wt 171 lb (77.6 kg)   SpO2 97%   BMI 28.90 kg/m  Body mass index is 28.9 kg/m. Wt Readings from Last 3 Encounters:  09/10/19 171 lb (77.6 kg)  05/15/19 166 lb 12.8 oz (75.7 kg)  09/20/18 164 lb (74.4 kg)   Constitutional: Slightly overweight, in NAD Eyes: PERRLA, EOMI, no exophthalmos  ENT: moist mucous membranes, no thyromegaly, no cervical lymphadenopathy Cardiovascular: RRR, No MRG Respiratory: CTA B Gastrointestinal: abdomen soft, NT, ND, BS+ Musculoskeletal: no deformities, strength intact in all 4 Skin: moist, warm, no rashes Neurological: no tremor with outstretched hands, DTR normal in all 4  ASSESSMENT: 1. Hashimoto thyroiditis - normal TPO antibodies -Slightly high antithyroglobulin antibodies  PLAN: 1. Hashimoto thyroiditis -Euthyroid -She had symptoms in the past that could be consistent with hypothyroid antibody levels including fatigue, anxiety, heat and cold intolerance but these have improved.  She continues to have dry eyes and hair loss and had a 7 pound weight gain since last visit, however, this coincided with working from home. -Patient is starting to feel better after starting to improve her diet and started exercise.  She is still on a gluten-free diet, also.  Overall, she started to feel better after changing her diet. -We reviewed together her previous TFTs from last visit and also her ATA antibodies.  These have normalized.  She has a diagnosis of Hashimoto's thyroiditis, now in remission -the treatment of Hashimoto's thyroiditis is limited to thyroid hormones in case TFTs are abnormal.  Supplements like selenium can be used, but I do not feel these are necessary with normal antibodies -At this visit, we will check her TFTs and ATA (she prefers to have  these checked at every visit) -I will see her back in a year   Office Visit on 09/10/2019  Component Date Value Ref Range Status  . TSH 09/10/2019 1.62  0.35 - 4.50 uIU/mL Final  . Free T4 09/10/2019 0.61  0.60 - 1.60 ng/dL Final   Comment: Specimens from patients who are undergoing biotin therapy and /or ingesting biotin supplements may contain high levels of biotin.  The higher biotin concentration in these specimens interferes with this Free T4 assay.  Specimens that contain high levels  of biotin may cause false high results for this Free T4 assay.  Please interpret results in light of the total clinical presentation of the patient.    . T3, Free 09/10/2019 3.3  2.3 - 4.2 pg/mL Final  . Thyroglobulin Ab 09/10/2019 1  < or = 1 IU/mL Final   The tests are normal, including the ATA antibody, which is detectable, but still very low, at the detection limit.  Philemon Kingdom, MD PhD [facility]

## 2019-09-10 NOTE — Patient Instructions (Signed)
Please stop at the lab.  Please come back for a follow-up appointment in 1 year.  

## 2019-09-11 LAB — T4, FREE: Free T4: 0.61 ng/dL (ref 0.60–1.60)

## 2019-09-11 LAB — T3, FREE: T3, Free: 3.3 pg/mL (ref 2.3–4.2)

## 2019-09-11 LAB — TSH: TSH: 1.62 u[IU]/mL (ref 0.35–4.50)

## 2019-09-11 LAB — THYROGLOBULIN ANTIBODY: Thyroglobulin Ab: 1 IU/mL (ref <=1)

## 2019-09-24 ENCOUNTER — Ambulatory Visit: Payer: BC Managed Care – PPO | Admitting: Obstetrics & Gynecology

## 2019-09-24 ENCOUNTER — Other Ambulatory Visit: Payer: Self-pay

## 2019-09-24 ENCOUNTER — Other Ambulatory Visit: Payer: Self-pay | Admitting: *Deleted

## 2019-09-24 ENCOUNTER — Encounter: Payer: Self-pay | Admitting: Obstetrics & Gynecology

## 2019-09-24 VITALS — BP 130/82 | Ht 64.75 in | Wt 170.0 lb

## 2019-09-24 DIAGNOSIS — Z3041 Encounter for surveillance of contraceptive pills: Secondary | ICD-10-CM | POA: Diagnosis not present

## 2019-09-24 DIAGNOSIS — E663 Overweight: Secondary | ICD-10-CM

## 2019-09-24 DIAGNOSIS — Z01419 Encounter for gynecological examination (general) (routine) without abnormal findings: Secondary | ICD-10-CM

## 2019-09-24 DIAGNOSIS — I10 Essential (primary) hypertension: Secondary | ICD-10-CM

## 2019-09-24 MED ORDER — NORETHIN-ETH ESTRAD-FE BIPHAS 1 MG-10 MCG / 10 MCG PO TABS: 1.0000 | ORAL_TABLET | Freq: Every day | ORAL | 4 refills | Status: DC

## 2019-09-24 MED ORDER — SPIRONOLACTONE 25 MG PO TABS: 25.0000 mg | ORAL_TABLET | Freq: Every day | ORAL | 0 refills | Status: DC

## 2019-09-24 NOTE — Progress Notes (Signed)
Jacqueline Calhoun [birth year] [id number]   History:    44 y.o. G1P1L1 Married.  Daughter is 31 yo.  RP:  Established patient presenting for annual gyn exam   HPI: Back on Lo Lo-Estrin because had a minor increase in her AST/ALT in 05/2019.  Minimal BTB on this low BCP.  No pelvic pain currently.  No pain with IC, but rarely sexually active.  Normal vaginal secretions.  Breasts normal. BMI 28.51.  Urine/BMs wnl.  Fasting Health Labs with Fam MD scheduled 10/2019.   Past medical history,surgical history, family history and social history were all reviewed and documented in the EPIC chart.  Gynecologic History Patient's last menstrual period was 08/27/2019.   Obstetric History OB History  Gravida Para Term Preterm AB Living  1 1     0 1  SAB TAB Ectopic Multiple Live Births      0        # Outcome Date GA Lbr Len/2nd Weight Sex Delivery Anes PTL Lv  1 Para              ROS: A ROS was performed and pertinent positives and negatives are included in the history.  GENERAL: No fevers or chills. HEENT: No change in vision, no earache, sore throat or sinus congestion. NECK: No pain or stiffness. CARDIOVASCULAR: No chest pain or pressure. No palpitations. PULMONARY: No shortness of breath, cough or wheeze. GASTROINTESTINAL: No abdominal pain, nausea, vomiting or diarrhea, melena or bright red blood per rectum. GENITOURINARY: No urinary frequency, urgency, hesitancy or dysuria. MUSCULOSKELETAL: No joint or muscle pain, no back pain, no recent trauma. DERMATOLOGIC: No rash, no itching, no lesions. ENDOCRINE: No polyuria, polydipsia, no heat or cold intolerance. No recent change in weight. HEMATOLOGICAL: No anemia or easy bruising or bleeding. NEUROLOGIC: No headache, seizures, numbness, tingling or weakness. PSYCHIATRIC: No depression, no loss of interest in normal activity or change in sleep pattern.     Exam:   BP 130/82   Ht 5' 4.75" (1.645 m)   Wt 170 lb (77.1 kg)   LMP 08/27/2019 Comment:  pill  BMI 28.51 kg/m   Body mass index is 28.51 kg/m.  General appearance : Well developed well nourished female. No acute distress HEENT: Eyes: no retinal hemorrhage or exudates,  Neck supple, trachea midline, no carotid bruits, no thyroidmegaly Lungs: Clear to auscultation, no rhonchi or wheezes, or rib retractions  Heart: Regular rate and rhythm, no murmurs or gallops Breast:Examined in sitting and supine position were symmetrical in appearance, no palpable masses or tenderness,  no skin retraction, no nipple inversion, no nipple discharge, no skin discoloration, no axillary or supraclavicular lymphadenopathy Abdomen: no palpable masses or tenderness, no rebound or guarding Extremities: no edema or skin discoloration or tenderness  Pelvic: Vulva: Normal             Vagina: No gross lesions or discharge  Cervix: No gross lesions or discharge  Uterus  AV, normal size, shape and consistency, non-tender and mobile  Adnexa  Without masses or tenderness  Anus: Normal   Assessment/Plan:  44 y.o. female for annual exam   1. Well female exam with routine gynecological exam Normal gynecologic exam.  Pap test January 2020 was negative, will recheck a Pap test at 2 to 3 years.  Breast exam normal.  Screening mammogram November 2020 was negative.  Health labs with family physician.  2. Encounter for surveillance of contraceptive pills Well on low Loestrin Fe.  No contraindication to continue on  this low dose birth control pill.  We will have her liver enzymes AST/ALT rechecked in March 2021, there were slightly increased in September 2020.  Prescription sent to pharmacy.  3. Overweight (BMI 25.0-29.9) Recommend a lower calorie/carb diet such as [employer].  Aerobic physical activities 5 times a week such as walking or dancing and light weightlifting every 2 days recommended.  Other orders - Norethindrone-Ethinyl Estradiol-Fe Biphas (LO LOESTRIN FE) 1 MG-10 MCG / 10 MCG tablet; Take 1  tablet by mouth daily.  Princess Bruins MD, 9:19 AM 09/24/2019

## 2019-09-24 NOTE — Patient Instructions (Signed)
1. Well female exam with routine gynecological exam Normal gynecologic exam.  Pap test January 2020 was negative, will recheck a Pap test at 2 to 3 years.  Breast exam normal.  Screening mammogram November 2020 was negative.  Health labs with family physician.  2. Encounter for surveillance of contraceptive pills Well on low Loestrin Fe.  No contraindication to continue on this low dose birth control pill.  We will have her liver enzymes AST/ALT rechecked in March 2021, there were slightly increased in September 2020.  Prescription sent to pharmacy.  3. Overweight (BMI 25.0-29.9) Recommend a lower calorie/carb diet such as [employer].  Aerobic physical activities 5 times a week such as walking or dancing and light weightlifting every 2 days recommended.  Other orders - Norethindrone-Ethinyl Estradiol-Fe Biphas (LO LOESTRIN FE) 1 MG-10 MCG / 10 MCG tablet; Take 1 tablet by mouth daily.  Jacqueline Calhoun, it was a pleasure seeing you today!

## 2019-11-20 ENCOUNTER — Ambulatory Visit (INDEPENDENT_AMBULATORY_CARE_PROVIDER_SITE_OTHER): Payer: BC Managed Care – PPO | Admitting: Family Medicine

## 2019-11-20 ENCOUNTER — Encounter: Payer: Self-pay | Admitting: Family Medicine

## 2019-11-20 ENCOUNTER — Other Ambulatory Visit: Payer: Self-pay

## 2019-11-20 VITALS — BP 130/82 | HR 82 | Temp 97.9°F | Resp 18 | Ht 64.75 in | Wt 170.6 lb

## 2019-11-20 DIAGNOSIS — E559 Vitamin D deficiency, unspecified: Secondary | ICD-10-CM

## 2019-11-20 DIAGNOSIS — I10 Essential (primary) hypertension: Secondary | ICD-10-CM | POA: Diagnosis not present

## 2019-11-20 DIAGNOSIS — Z0000 Encounter for general adult medical examination without abnormal findings: Secondary | ICD-10-CM

## 2019-11-20 DIAGNOSIS — K219 Gastro-esophageal reflux disease without esophagitis: Secondary | ICD-10-CM | POA: Diagnosis not present

## 2019-11-20 LAB — COMPREHENSIVE METABOLIC PANEL
ALT: 46 U/L — ABNORMAL HIGH (ref 0–35)
AST: 36 U/L (ref 0–37)
Albumin: 4 g/dL (ref 3.5–5.2)
Alkaline Phosphatase: 60 U/L (ref 39–117)
BUN: 12 mg/dL (ref 6–23)
CO2: 26 mEq/L (ref 19–32)
Calcium: 8.9 mg/dL (ref 8.4–10.5)
Chloride: 103 mEq/L (ref 96–112)
Creatinine, Ser: 0.89 mg/dL (ref 0.40–1.20)
GFR: 68.38 mL/min (ref >60.00)
Glucose, Bld: 85 mg/dL (ref 70–99)
Potassium: 4.1 mEq/L (ref 3.5–5.1)
Sodium: 136 mEq/L (ref 135–145)
Total Bilirubin: 0.5 mg/dL (ref 0.2–1.2)
Total Protein: 6.5 g/dL (ref 6.0–8.3)

## 2019-11-20 LAB — LIPID PANEL
Cholesterol: 177 mg/dL (ref 0–200)
HDL: 50.2 mg/dL (ref >39.00)
LDL Cholesterol: 108 mg/dL — ABNORMAL HIGH (ref 0–99)
NonHDL: 126.44
Total CHOL/HDL Ratio: 4
Triglycerides: 93 mg/dL (ref 0.0–149.0)
VLDL: 18.6 mg/dL (ref 0.0–40.0)

## 2019-11-20 LAB — CBC WITH DIFFERENTIAL/PLATELET
Basophils Absolute: 0.1 10*3/uL (ref 0.0–0.1)
Basophils Relative: 1 % (ref 0.0–3.0)
Eosinophils Absolute: 0.1 10*3/uL (ref 0.0–0.7)
Eosinophils Relative: 1.6 % (ref 0.0–5.0)
HCT: 41.1 % (ref 36.0–46.0)
Hemoglobin: 13.7 g/dL (ref 12.0–15.0)
Lymphocytes Relative: 23 % (ref 12.0–46.0)
Lymphs Abs: 1.9 10*3/uL (ref 0.7–4.0)
MCHC: 33.4 g/dL (ref 30.0–36.0)
MCV: 86.5 fl (ref 78.0–100.0)
Monocytes Absolute: 0.6 10*3/uL (ref 0.1–1.0)
Monocytes Relative: 7.7 % (ref 3.0–12.0)
Neutro Abs: 5.6 10*3/uL (ref 1.4–7.7)
Neutrophils Relative %: 66.7 % (ref 43.0–77.0)
Platelets: 281 10*3/uL (ref 150.0–400.0)
RBC: 4.75 Mil/uL (ref 3.87–5.11)
RDW: 12.8 % (ref 11.5–15.5)
WBC: 8.4 10*3/uL (ref 4.0–10.5)

## 2019-11-20 LAB — TSH: TSH: 1.08 u[IU]/mL (ref 0.35–4.50)

## 2019-11-20 LAB — VITAMIN D 25 HYDROXY (VIT D DEFICIENCY, FRACTURES): VITD: 35.82 ng/mL (ref 30.00–100.00)

## 2019-11-20 MED ORDER — OMEPRAZOLE 20 MG PO CPDR: 20.0000 mg | DELAYED_RELEASE_CAPSULE | Freq: Every day | ORAL | 3 refills | Status: DC

## 2019-11-20 MED ORDER — OLMESARTAN MEDOXOMIL 40 MG PO TABS: 40.0000 mg | ORAL_TABLET | Freq: Every day | ORAL | 1 refills | Status: DC

## 2019-11-20 NOTE — Progress Notes (Signed)
Subjective:     Jacqueline Calhoun is a 44 y.o. female and is here for a comprehensive physical exam. The patient reports no problems.  Social History   Socioeconomic History  . Marital status: Married    Spouse name: Not on file  . Number of children: Not on file  . Years of education: Not on file  . Highest education level: Not on file  Occupational History  . Not on file  Tobacco Use  . Smoking status: Never Smoker  . Smokeless tobacco: Never Used  Substance and Sexual Activity  . Alcohol use: Yes    Comment: occasional  . Drug use: No  . Sexual activity: Yes    Partners: Male    Birth control/protection: Pill    Comment: 1ST intercourse-15, partners- 49, married- 21 yrs  Other Topics Concern  . Not on file  Social History Narrative  . Not on file   Social Determinants of Health   Financial Resource Strain:   . Difficulty of Paying Living Expenses:   Food Insecurity:   . Worried About Charity fundraiser in the Last Year:   . Arboriculturist in the Last Year:   Transportation Needs:   . Film/video editor (Medical):   Marland Kitchen Lack of Transportation (Non-Medical):   Physical Activity:   . Days of Exercise per Week:   . Minutes of Exercise per Session:   Stress:   . Feeling of Stress :   Social Connections:   . Frequency of Communication with Friends and Family:   . Frequency of Social Gatherings with Friends and Family:   . Attends Religious Services:   . Active Member of Clubs or Organizations:   . Attends Archivist Meetings:   Marland Kitchen Marital Status:   Intimate Partner Violence:   . Fear of Current or Ex-Partner:   . Emotionally Abused:   Marland Kitchen Physically Abused:   . Sexually Abused:    Health Maintenance  Topic Date Due  . HIV Screening  Never done  . TETANUS/TDAP  09/05/2017  . MAMMOGRAM  07/09/2020  . PAP SMEAR-Modifier  09/20/2021  . INFLUENZA VACCINE  Completed    The following portions of the patient's history were reviewed and updated as  appropriate:  She  has a past medical history of Anxiety, Bladder tumor, Depression, Hashimoto's thyroiditis, History of ovarian cyst, Hypertension, IBS (irritable bowel syndrome), and Wears glasses. She does not have any pertinent problems on file. She  has a past surgical history that includes No past surgeries and Transurethral resection of bladder tumor (N/A, 07/12/2016). Her family history includes Alzheimer's disease in her paternal grandmother; Anxiety disorder in her father; Cancer in her maternal aunt and paternal aunt; Cancer (age of onset: 49) in her paternal grandfather; Heart disease (age of onset: 2) in her maternal grandfather and maternal grandmother; Hypertension in her father and mother; Pulmonary embolism in her mother; Thyroid disease in her paternal aunt and paternal grandmother. She  reports that she has never smoked. She has never used smokeless tobacco. She reports current alcohol use. She reports that she does not use drugs. She has a current medication list which includes the following prescription(s): fluticasone, ibuprofen, multivitamin, norethindrone-ethinyl estradiol-fe biphas, olmesartan, omeprazole, OVER THE COUNTER MEDICATION, lactobacillus, and spironolactone. Current Outpatient Medications on File Prior to Visit  Medication Sig Dispense Refill  . fluticasone (FLONASE) 50 MCG/ACT nasal spray Place 2 sprays into both nostrils daily.    . Ibuprofen (ADVIL) 200 MG CAPS Take  by mouth as needed.    . Multiple Vitamin (MULTIVITAMIN) tablet Take 1 tablet by mouth daily.      . Norethindrone-Ethinyl Estradiol-Fe Biphas (LO LOESTRIN FE) 1 MG-10 MCG / 10 MCG tablet Take 1 tablet by mouth daily. 3 Package 4  . OVER THE COUNTER MEDICATION 1 tablet as needed. GABA supplement    . Probiotic Product (PROBIOTIC ACIDOPHILUS PO) Take 1 tablet by mouth daily.      Marland Kitchen spironolactone (ALDACTONE) 25 MG tablet Take 1 tablet (25 mg total) by mouth daily. 90 tablet 0   No current  facility-administered medications on file prior to visit.   She has No Known Allergies..  Review of Systems Review of Systems  Constitutional: Negative for activity change, appetite change and fatigue.  HENT: Negative for hearing loss, congestion, tinnitus and ear discharge.  dentist q61m Eyes: Negative for visual disturbance (see optho q1y -- vision corrected to 20/20 with glasses).  Respiratory: Negative for cough, chest tightness and shortness of breath.   Cardiovascular: Negative for chest pain, palpitations and leg swelling.  Gastrointestinal: Negative for abdominal pain, diarrhea, constipation and abdominal distention.  Genitourinary: Negative for urgency, frequency, decreased urine volume and difficulty urinating.  Musculoskeletal: Negative for back pain, arthralgias and gait problem.  Skin: Negative for color change, pallor and rash.  Neurological: Negative for dizziness, light-headedness, numbness and headaches.  Hematological: Negative for adenopathy. Does not bruise/bleed easily.  Psychiatric/Behavioral: Negative for suicidal ideas, confusion, sleep disturbance, self-injury, dysphoric mood, decreased concentration and agitation.       Objective:    BP 130/82 (BP Location: Right Arm, Patient Position: Sitting, Cuff Size: Normal)   Pulse 82   Temp 97.9 F (36.6 C) (Temporal)   Resp 18   Ht 5' 4.75" (1.645 m)   Wt 170 lb 9.6 oz (77.4 kg)   LMP 11/06/2019   SpO2 99%   BMI 28.61 kg/m  General appearance: alert, cooperative, appears stated age and no distress Head: Normocephalic, without obvious abnormality, atraumatic Eyes: negative findings: lids and lashes normal, conjunctivae and sclerae normal and pupils equal, round, reactive to light and accomodation Ears: normal TM's and external ear canals both ears Throat: lips, mucosa, and tongue normal; teeth and gums normal Neck: no adenopathy, no carotid bruit, no JVD, supple, symmetrical, trachea midline and thyroid not  enlarged, symmetric, no tenderness/mass/nodules Back: symmetric, no curvature. ROM normal. No CVA tenderness. Lungs: clear to auscultation bilaterally Breasts: gyn Heart: regular rate and rhythm, S1, S2 normal, no murmur, click, rub or gallop Abdomen: soft, non-tender; bowel sounds normal; no masses,  no organomegaly Pelvic: deferred--gyn Extremities: extremities normal, atraumatic, no cyanosis or edema Pulses: 2+ and symmetric Skin: Skin color, texture, turgor normal. No rashes or lesions Lymph nodes: Cervical, supraclavicular, and axillary nodes normal. Neurologic: Alert and oriented X 3, normal strength and tone. Normal symmetric reflexes. Normal coordination and gait    Assessment:    Healthy female exam.      Plan:     ghm utd Check labs  See After Visit Summary for Counseling Recommendations    1. Gastroesophageal reflux disease stable - omeprazole (PRILOSEC) 20 MG capsule; Take 1 capsule (20 mg total) by mouth daily.  Dispense: 90 capsule; Refill: 3 - Ambulatory referral to Gastroenterology  2. Essential hypertension Well controlled, no changes to meds. Encouraged heart healthy diet such as the DASH diet and exercise as tolerated.   - olmesartan (BENICAR) 40 MG tablet; Take 1 tablet (40 mg total) by mouth daily.  Dispense:  90 tablet; Refill: 1 - Lipid panel - Comprehensive metabolic panel  3. Preventative health care ghm utd Check labs  See above  - Lipid panel - CBC with Differential/Platelet - TSH - Comprehensive metabolic panel - Vitamin D (25 hydroxy)  4. Vitamin D deficiency   - Vitamin D (25 hydroxy)

## 2019-11-20 NOTE — Patient Instructions (Signed)

## 2020-01-24 ENCOUNTER — Encounter: Payer: Self-pay | Admitting: Family Medicine

## 2020-02-13 ENCOUNTER — Other Ambulatory Visit: Payer: Self-pay

## 2020-02-13 DIAGNOSIS — I10 Essential (primary) hypertension: Secondary | ICD-10-CM

## 2020-02-13 MED ORDER — SPIRONOLACTONE 25 MG PO TABS: 25.0000 mg | ORAL_TABLET | Freq: Every day | ORAL | 1 refills | Status: DC

## 2020-02-28 ENCOUNTER — Other Ambulatory Visit: Payer: Self-pay | Admitting: Urology

## 2020-02-28 MED ORDER — GEMCITABINE CHEMO FOR BLADDER INSTILLATION 2000 MG: 2000.0000 mg | Freq: Once | INTRAVENOUS | Status: AC

## 2020-02-29 ENCOUNTER — Other Ambulatory Visit: Payer: Self-pay

## 2020-02-29 ENCOUNTER — Encounter (HOSPITAL_BASED_OUTPATIENT_CLINIC_OR_DEPARTMENT_OTHER): Payer: Self-pay | Admitting: Urology

## 2020-02-29 NOTE — Progress Notes (Signed)
NEW Covid Policy July 2341  Surgery Day:  03-07-20  Facility:  [facility]  Type of Surgery: cysto with bladder biopsy  Fully Covid Vaccinated:   1)10-26-2019                                          2) 10-26-2019                                          Where? [facility] [address]                                           Type? [facility]     Do you have symptoms? no  In the past 14 days:        Have you had any symptoms?no       Have you been tested covid positive?no       Have you been in contact with someone covid positive?no        Is pt Immuno-compromised?no

## 2020-02-29 NOTE — Progress Notes (Signed)
Spoke w/ via phone for pre-op interview---pt Lab needs dos----   I stat 8, ekg, urine poct            COVID test ------fully vaccinated no covid test needed Arrive at -------1215 pm 03-07-20 No food after midnight clear liquids from midnight until 1115 am then npo Medications to take morning of surgery -----omeprazole Diabetic medication -----n/a Patient Special Instructions -----none Pre-Op special Istructions -----none Patient verbalized understanding of instructions that were given at this phone interview. Patient denies shortness of breath, chest pain, fever, cough a this phone interview.

## 2020-03-07 ENCOUNTER — Other Ambulatory Visit: Payer: Self-pay

## 2020-03-07 ENCOUNTER — Ambulatory Visit (HOSPITAL_BASED_OUTPATIENT_CLINIC_OR_DEPARTMENT_OTHER): Payer: BC Managed Care – PPO | Admitting: Anesthesiology

## 2020-03-07 ENCOUNTER — Encounter (HOSPITAL_BASED_OUTPATIENT_CLINIC_OR_DEPARTMENT_OTHER): Payer: Self-pay | Admitting: Urology

## 2020-03-07 ENCOUNTER — Encounter (HOSPITAL_BASED_OUTPATIENT_CLINIC_OR_DEPARTMENT_OTHER)
Admission: RE | Disposition: A | Discharge status: Home / Self Care | Payer: Self-pay | Source: Ambulatory Visit | Attending: Urology

## 2020-03-07 ENCOUNTER — Ambulatory Visit (HOSPITAL_BASED_OUTPATIENT_CLINIC_OR_DEPARTMENT_OTHER)
Admission: RE | Admit: 2020-03-07 | Discharge: 2020-03-07 | Disposition: A | Discharge status: Home / Self Care | Payer: BC Managed Care – PPO | Source: Ambulatory Visit | Attending: Urology | Admitting: Urology

## 2020-03-07 DIAGNOSIS — I10 Essential (primary) hypertension: Secondary | ICD-10-CM | POA: Diagnosis not present

## 2020-03-07 DIAGNOSIS — Z803 Family history of malignant neoplasm of breast: Secondary | ICD-10-CM | POA: Diagnosis not present

## 2020-03-07 DIAGNOSIS — Z8249 Family history of ischemic heart disease and other diseases of the circulatory system: Secondary | ICD-10-CM | POA: Diagnosis not present

## 2020-03-07 DIAGNOSIS — C679 Malignant neoplasm of bladder, unspecified: Secondary | ICD-10-CM | POA: Insufficient documentation

## 2020-03-07 HISTORY — DX: Gastro-esophageal reflux disease without esophagitis: K21.9

## 2020-03-07 HISTORY — PX: CYSTOSCOPY WITH BIOPSY: SHX5122

## 2020-03-07 LAB — POCT I-STAT, CHEM 8
BUN: 10 mg/dL (ref 6–20)
Calcium, Ion: 1.06 mmol/L — ABNORMAL LOW (ref 1.15–1.40)
Chloride: 103 mmol/L (ref 98–111)
Creatinine, Ser: 0.8 mg/dL (ref 0.44–1.00)
Glucose, Bld: 89 mg/dL (ref 70–99)
HCT: 44 % (ref 36.0–46.0)
Hemoglobin: 15 g/dL (ref 12.0–15.0)
Potassium: 3.9 mmol/L (ref 3.5–5.1)
Sodium: 140 mmol/L (ref 135–145)
TCO2: 20 mmol/L — ABNORMAL LOW (ref 22–32)

## 2020-03-07 LAB — POCT PREGNANCY, URINE: Preg Test, Ur: NEGATIVE

## 2020-03-07 SURGERY — CYSTOSCOPY, WITH BIOPSY
Anesthesia: General | Site: Bladder

## 2020-03-07 MED ORDER — HYDROMORPHONE HCL 1 MG/ML IJ SOLN
INTRAMUSCULAR | Status: AC
  Filled 2020-03-07: qty 1

## 2020-03-07 MED ORDER — PROPOFOL 10 MG/ML IV BOLUS
INTRAVENOUS | Status: AC
  Filled 2020-03-07: qty 20

## 2020-03-07 MED ORDER — ACETAMINOPHEN 500 MG PO TABS
ORAL_TABLET | ORAL | Status: AC
  Filled 2020-03-07: qty 2

## 2020-03-07 MED ORDER — DEXAMETHASONE SODIUM PHOSPHATE 10 MG/ML IJ SOLN
INTRAMUSCULAR | Status: AC
  Filled 2020-03-07: qty 1

## 2020-03-07 MED ORDER — LACTATED RINGERS IV SOLN: INTRAVENOUS | Status: DC

## 2020-03-07 MED ORDER — FENTANYL CITRATE (PF) 100 MCG/2ML IJ SOLN
INTRAMUSCULAR | Status: AC
  Filled 2020-03-07: qty 2

## 2020-03-07 MED ORDER — CEFAZOLIN SODIUM-DEXTROSE 2-4 GM/100ML-% IV SOLN
2.0000 g | Freq: Once | INTRAVENOUS | Status: AC
  Administered 2020-03-07: 2 g via INTRAVENOUS

## 2020-03-07 MED ORDER — ACETAMINOPHEN 500 MG PO TABS
1000.0000 mg | ORAL_TABLET | Freq: Once | ORAL | Status: AC
  Administered 2020-03-07: 1000 mg via ORAL

## 2020-03-07 MED ORDER — PROMETHAZINE HCL 25 MG/ML IJ SOLN: 6.2500 mg | INTRAMUSCULAR | Status: DC | PRN

## 2020-03-07 MED ORDER — MIDAZOLAM HCL 5 MG/5ML IJ SOLN
INTRAMUSCULAR | Status: DC | PRN
  Administered 2020-03-07: 2 mg via INTRAVENOUS

## 2020-03-07 MED ORDER — OXYCODONE HCL 5 MG/5ML PO SOLN: 5.0000 mg | Freq: Once | ORAL | Status: DC | PRN

## 2020-03-07 MED ORDER — ONDANSETRON HCL 4 MG/2ML IJ SOLN
INTRAMUSCULAR | Status: DC | PRN
  Administered 2020-03-07: 4 mg via INTRAVENOUS

## 2020-03-07 MED ORDER — LIDOCAINE 2% (20 MG/ML) 5 ML SYRINGE
INTRAMUSCULAR | Status: DC | PRN
  Administered 2020-03-07: 80 mg via INTRAVENOUS

## 2020-03-07 MED ORDER — OXYCODONE HCL 5 MG PO TABS: 5.0000 mg | ORAL_TABLET | Freq: Once | ORAL | Status: DC | PRN

## 2020-03-07 MED ORDER — GEMCITABINE CHEMO FOR BLADDER INSTILLATION 2000 MG
2000.0000 mg | Freq: Once | INTRAVENOUS | Status: AC
  Administered 2020-03-07: 2000 mg via INTRAVESICAL
  Filled 2020-03-07: qty 2000

## 2020-03-07 MED ORDER — FENTANYL CITRATE (PF) 100 MCG/2ML IJ SOLN
INTRAMUSCULAR | Status: DC | PRN
  Administered 2020-03-07 (×2): 50 ug via INTRAVENOUS

## 2020-03-07 MED ORDER — HYDROMORPHONE HCL 1 MG/ML IJ SOLN
0.2500 mg | INTRAMUSCULAR | Status: DC | PRN
  Administered 2020-03-07: 0.5 mg via INTRAVENOUS

## 2020-03-07 MED ORDER — LIDOCAINE 2% (20 MG/ML) 5 ML SYRINGE
INTRAMUSCULAR | Status: AC
  Filled 2020-03-07: qty 5

## 2020-03-07 MED ORDER — STERILE WATER FOR IRRIGATION IR SOLN
Status: DC | PRN
  Administered 2020-03-07: 3000 mL via INTRAVESICAL

## 2020-03-07 MED ORDER — MIDAZOLAM HCL 2 MG/2ML IJ SOLN
INTRAMUSCULAR | Status: AC
  Filled 2020-03-07: qty 2

## 2020-03-07 MED ORDER — KETOROLAC TROMETHAMINE 30 MG/ML IJ SOLN
INTRAMUSCULAR | Status: DC | PRN
Start: 2020-03-07 — End: 2020-03-07
  Administered 2020-03-07: 30 mg via INTRAVENOUS

## 2020-03-07 MED ORDER — ONDANSETRON HCL 4 MG/2ML IJ SOLN
INTRAMUSCULAR | Status: AC
  Filled 2020-03-07: qty 2

## 2020-03-07 MED ORDER — KETOROLAC TROMETHAMINE 30 MG/ML IJ SOLN: 30.0000 mg | Freq: Once | INTRAMUSCULAR | Status: DC | PRN

## 2020-03-07 MED ORDER — PROPOFOL 10 MG/ML IV BOLUS
INTRAVENOUS | Status: DC | PRN
  Administered 2020-03-07: 150 mg via INTRAVENOUS

## 2020-03-07 MED ORDER — CEFAZOLIN SODIUM-DEXTROSE 2-4 GM/100ML-% IV SOLN
INTRAVENOUS | Status: AC
  Filled 2020-03-07: qty 100

## 2020-03-07 MED ORDER — OXYBUTYNIN CHLORIDE 5 MG PO TABS: 5.0000 mg | ORAL_TABLET | Freq: Three times a day (TID) | ORAL | 1 refills | Status: DC | PRN

## 2020-03-07 MED ORDER — DEXAMETHASONE SODIUM PHOSPHATE 10 MG/ML IJ SOLN
INTRAMUSCULAR | Status: DC | PRN
  Administered 2020-03-07: 10 mg via INTRAVENOUS

## 2020-03-07 MED ORDER — TRAMADOL HCL 50 MG PO TABS: 50.0000 mg | ORAL_TABLET | Freq: Four times a day (QID) | ORAL | 0 refills | Status: AC | PRN

## 2020-03-07 SURGICAL SUPPLY — 20 items
BAG DRAIN URO-CYSTO SKYTR STRL (DRAIN) ×3 IMPLANT
BAG URINE DRAIN 2000ML AR STRL (UROLOGICAL SUPPLIES) ×3 IMPLANT
CATH FOLEY 2WAY SLVR  5CC 18FR (CATHETERS) ×3
CATH FOLEY 2WAY SLVR 5CC 18FR (CATHETERS) ×1 IMPLANT
CATH ROBINSON RED A/P 14FR (CATHETERS) IMPLANT
CLOTH BEACON ORANGE TIMEOUT ST (SAFETY) ×3 IMPLANT
ELECT REM PT RETURN 9FT ADLT (ELECTROSURGICAL) ×3
ELECTRODE REM PT RTRN 9FT ADLT (ELECTROSURGICAL) ×1 IMPLANT
GLOVE BIO SURGEON STRL SZ7.5 (GLOVE) ×3 IMPLANT
GOWN STRL REUS W/ TWL XL LVL3 (GOWN DISPOSABLE) ×1 IMPLANT
GOWN STRL REUS W/TWL XL LVL3 (GOWN DISPOSABLE) ×3
KIT TURNOVER CYSTO (KITS) ×3 IMPLANT
MANIFOLD NEPTUNE II (INSTRUMENTS) ×3 IMPLANT
NDL SAFETY ECLIPSE 18X1.5 (NEEDLE) IMPLANT
NEEDLE HYPO 18GX1.5 SHARP (NEEDLE)
PACK CYSTO (CUSTOM PROCEDURE TRAY) ×3 IMPLANT
SYR 20ML LL LF (SYRINGE) ×3 IMPLANT
TUBE CONNECTING 12'X1/4 (SUCTIONS) ×1
TUBE CONNECTING 12X1/4 (SUCTIONS) ×2 IMPLANT
WATER STERILE IRR 3000ML UROMA (IV SOLUTION) ×3 IMPLANT

## 2020-03-07 NOTE — Interval H&P Note (Signed)
History and Physical Interval Note:  03/07/2020 12:32 PM  Jacqueline Calhoun  has presented today for surgery, with the diagnosis of BLADDER CANCER.  The various methods of treatment have been discussed with the patient and family. After consideration of risks, benefits and other options for treatment, the patient has consented to  Procedure(s) with comments: CYSTOSCOPY WITH BLADDER  BIOPSY/FULGURATION/ INSTILLATION OF INTRAVESICAL GEMCITABINE (N/A) - ONLY NEEDS 30 MIN as a surgical intervention.  The patient's history has been reviewed, patient examined, no change in status, stable for surgery.  I have reviewed the patient's chart and labs.  Questions were answered to the patient's satisfaction.     Conception Oms [name]

## 2020-03-07 NOTE — Anesthesia Postprocedure Evaluation (Signed)
Anesthesia Post Note  Patient: Manuel Lawhead  Procedure(s) Performed: CYSTOSCOPY WITH BLADDER  BIOPSY/FULGURATION/ INSTILLATION OF INTRAVESICAL GEMCITABINE (N/A Bladder)     Patient location during evaluation: PACU Anesthesia Type: General Level of consciousness: awake and alert Pain management: pain level controlled Vital Signs Assessment: post-procedure vital signs reviewed and stable Respiratory status: spontaneous breathing, nonlabored ventilation, respiratory function stable and patient connected to nasal cannula oxygen Cardiovascular status: blood pressure returned to baseline and stable Postop Assessment: no apparent nausea or vomiting Anesthetic complications: no   No complications documented.  Last Vitals:  Vitals:   03/07/20 1530 03/07/20 1545  BP: (!) 141/84 139/84  Pulse: 75 76  Resp: 12 13  Temp: 37 C   SpO2: 97% 97%    Last Pain:  Vitals:   03/07/20 1600  TempSrc:   PainSc: 0-No pain                 Sharon Stapel

## 2020-03-07 NOTE — Anesthesia Procedure Notes (Signed)
Procedure Name: LMA Insertion Date/Time: 03/07/2020 1:47 PM Performed by: Gwyndolyn Saxon, CRNA Pre-anesthesia Checklist: Patient identified, Emergency Drugs available, Suction available and Patient being monitored Patient Re-evaluated:Patient Re-evaluated prior to induction Oxygen Delivery Method: Circle system utilized Preoxygenation: Pre-oxygenation with 100% oxygen Induction Type: IV induction Ventilation: Mask ventilation without difficulty LMA: LMA inserted LMA Size: 4.0 Number of attempts: 1 Placement Confirmation: positive ETCO2 and breath sounds checked- equal and bilateral Tube secured with: Tape Dental Injury: Teeth and Oropharynx as per pre-operative assessment

## 2020-03-07 NOTE — Op Note (Signed)
Operative Note  Preoperative diagnosis:  1.  1 cm posterior bladder wall tumor 2.  History of TA urothelial carcinoma the bladder  Postoperative diagnosis: 1.  1 cm posterior bladder wall tumor 2.  History of TA urothelial carcinoma the bladder  Procedure(s): 1.  Cystoscopy with bladder biopsy and fulguration 2.  Intravesical instillation of gemcitabine  Surgeon: Ellison Hughs, MD  Assistants:  None  Anesthesia:  General  Complications:  None  EBL: Less than 5 mL  Specimens: 1.  Posterior bladder wall tumor  Drains/Catheters: 1.  18 French Foley catheter  Intraoperative findings:   1. 1 cm papillary bladder tumor involving the left posterior bladder wall  Indication:  Jacqueline Calhoun is a 44 y.o. female with a history of TA urothelial carcinoma of the bladder, initially diagnosed in 2017.  She was found to have a 1 cm papillary bladder tumor recurrence during surveillance cystoscopy on 02/25/2020.  She has been consented for the above procedures, voices understanding and wishes to proceed.  Description of procedure:  After informed consent was obtained, the patient was brought to the operating room and general LMA anesthesia was administered. The patient was then placed in the dorsolithotomy position and prepped and draped in the usual sterile fashion. A timeout was performed. A 23 French rigid cystoscope was then inserted into the urethral meatus and advanced into the bladder under direct vision. A complete bladder survey revealed a 1 cm papillary bladder tumor involving the posterior bladder wall.  Cold cup biopsy forceps were then used to completely remove the papillary bladder tumor.  The area of excision was then extensively fulgurated with electrocautery until hemostasis was achieved.  The tumor specimen was sent off for permanent section.  There was no gross visual evidence of bladder perforation following the biopsy.  An 3 French Foley catheter was then inserted in  the bladder and irrigated. The irrigant returned slightly pink with no clots. The patient was awakened and taken to the recovery room.  While in the recovery room 2000 mg of gemcitabine in 50 mL of water was instilled in the bladder through the catheter and the catheter was plugged. This will remain indwelling for approximately one hour. It will then be drained from the bladder and the catheter will be removed and the patient discharged home.  Plan: Follow-up on 03/24/2020 to discuss pathology results

## 2020-03-07 NOTE — H&P (Signed)
Office Visit Report     02/25/2020   --------------------------------------------------------------------------------   Jacqueline Calhoun  MRN: [id number]  DOB: 1976-05-10, 44 year old Female  PRIMARY CARE:  Jacqueline Schanz, DO  REFERRING:    PROVIDER:  Baruch Calhoun, M.D.  TREATING:  Jacqueline Calhoun, M.D.  LOCATION:  [facility] 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 Pilar Calhoun on 07/12/2016.   02/25/20: The patient is here today for a routine follow-up and surveillance cystoscopy. She denies any new or worsening urinary complaints since her last OV. Denies interval UTIs, dysuria or hematuria. Doing well.     ALLERGIES: No Allergies    MEDICATIONS: Ibuprofen  Lo Loestrin Fe  Olmesartan Medoxomil 40 mg tablet  Probiotics  Spironolactone 25 mg tablet  Tylenol  Vitamin B Complex  Vitamin C  Vitamin D     GU PSH: Cystoscopy - 10/10/2018, 03/09/2018, 2018, 2018, 2017 Cystoscopy TURBT 2-5 cm - 2017 Locm 300-399Mg /Ml Iodine,1Ml - 2017     NON-GU PSH: No Non-GU PSH    GU PMH: History of bladder cancer - 10/10/2018 Bladder Cancer overlapping sites - 2018, - 2018, - 2017 Bladder tumor/neoplasm - 2017 Gross hematuria - 2017, - 2017    NON-GU PMH: Anxiety GERD Hypertension    FAMILY HISTORY: Breast Cancer - Runs in Family Heart Disease - Grandfather Thyroid Disease - Runs in Family   SOCIAL HISTORY: Marital Status: Married Preferred Language: English; Ethnicity: Not Hispanic Or Latino; Race: White Current Smoking Status: Patient has never smoked.  Social Drinker.  Drinks 2 caffeinated drinks per day. Patient's occupation Community education officer.    REVIEW OF SYSTEMS:    GU Review Female:   Patient denies frequent urination, hard to postpone  urination, burning /pain with urination, get up at night to urinate, leakage of urine, stream starts and stops, trouble starting your stream, have to strain to urinate, and being pregnant.  Gastrointestinal (Upper):   Patient denies nausea, vomiting, and indigestion/ heartburn.  Gastrointestinal (Lower):   Patient denies diarrhea and constipation.  Constitutional:   Patient denies fever, night sweats, weight loss, and fatigue.  Skin:   Patient denies skin rash/ lesion and itching.  Eyes:   Patient denies blurred vision and double vision.  Ears/ Nose/ Throat:   Patient denies sore throat and sinus problems.  Hematologic/Lymphatic:   Patient denies swollen glands and easy bruising.  Cardiovascular:   Patient denies leg swelling and chest pains.  Respiratory:   Patient denies cough and shortness of breath.  Endocrine:   Patient denies excessive thirst.  Musculoskeletal:   Patient denies back pain and joint pain.  Neurological:   Patient denies headaches and dizziness.  Psychologic:   Patient denies depression and anxiety.   VITAL SIGNS:      02/25/2020 03:41 PM  Weight 168 lb / 76.2 kg  Height 64.5 in / 163.83 cm  BP 126/84 mmHg  Heart Rate 80 /min  Temperature 97.7 F / 36.5 C  BMI 28.4 kg/m   GU PHYSICAL EXAMINATION:    External Genitalia: No hirsutism, no rash, no scarring, no cyst, no erythematous lesion, no papular lesion, no blanched lesion, no warty lesion. No edema.  Urethral Meatus: Normal size. Normal position. No discharge.  Urethra: No tenderness, no mass, no scarring. No hypermobility. No leakage.  Bladder: Normal  to palpation, no tenderness, no mass, normal size.  Vagina: No atrophy, no stenosis. No rectocele. No cystocele. No enterocele.   MULTI-SYSTEM PHYSICAL EXAMINATION:    Constitutional: Well-nourished. No physical deformities. Normally developed. Good grooming.  Neurologic / Psychiatric: Oriented to time, oriented to place, oriented to person. No depression, no  anxiety, no agitation.  Musculoskeletal: Normal gait and station of head and neck.     PAST DATA REVIEW: None   PROCEDURES:         Flexible Cystoscopy - 52000  Risks, benefits, and some of the potential complications of the procedure were discussed at length with the patient including infection, bleeding, voiding discomfort, urinary retention, fever, chills, sepsis, and others. All questions were answered. Informed consent was obtained. Antibiotic prophylaxis was given. Sterile technique and intraurethral analgesia were used.  Meatus:  Normal size. Normal location. Normal condition.  Urethra:  No hypermobility. No leakage.  Ureteral Orifices:  Normal location. Normal size. Normal shape. Effluxed clear urine.  Bladder:  A left lateral wall tumor. < 1/2 cm tumor. No trabeculation. Normal mucosa. No stones.      The lower urinary tract was carefully examined. The procedure was well-tolerated and without complications. Antibiotic instructions were given. Instructions were given to call the office immediately for bloody urine, difficulty urinating, urinary retention, painful or frequent urination, fever, chills, nausea, vomiting or other illness. The patient stated that she understood these instructions and would comply with them.         Urinalysis w/Scope Dipstick Dipstick Cont'd Micro  Color: Straw Bilirubin: Neg mg/dL WBC/hpf: NS (Not Seen)  Appearance: Clear Ketones: Neg mg/dL RBC/hpf: 0 - 2/hpf  Specific Gravity: 1.010 Blood: 1+ ery/uL Bacteria: NS (Not Seen)  pH: 5.5 Protein: Neg mg/dL Cystals: NS (Not Seen)  Glucose: Neg mg/dL Urobilinogen: 0.2 mg/dL Casts: NS (Not Seen)    Nitrites: Neg Trichomonas: Not Present    Leukocyte Esterase: Neg leu/uL Mucous: Not Present      Epithelial Cells: NS (Not Seen)      Yeast: NS (Not Seen)      Sperm: Not Present    ASSESSMENT:      ICD-10 Details  1 GU:   Bladder tumor/neoplasm - D41.4 Chronic, Worsening   PLAN:           Orders Labs  BUN/Creatinine          Schedule X-Rays: 1 Week - C.T. Hematuria With and Without I.V. Contrast  Return Visit/Planned Activity: ASAP - Schedule Surgery          Document Letter(s):  Created for Patient: Clinical Summary   Created for Jacqueline Schanz, DO         Notes:   -Cystoscopy today revealed a 5 mm papillary bladder tumor involving the left posterior bladder wall.  The risks, benefits and alternatives of cystoscopy with bladder biopsy and intravesical gemcitabine instillation was discussed with the patient. The risks include, but are not limited to, bleeding, urinary tract infection, bladder perforation requiring prolonged catheterization and/or open bladder repair, ureteral obstruction, voiding dysfunction and the inherent risks of general anesthesia. The patient voices understanding and wishes to proceed.  -CTU pending to assess her upper urinary tracts

## 2020-03-07 NOTE — Anesthesia Preprocedure Evaluation (Addendum)
Anesthesia Evaluation  Patient identified by MRN, date of birth, ID band Patient awake    Reviewed: Allergy & Precautions, NPO status , Patient's Chart, lab work & pertinent test results  Airway Mallampati: I  TM Distance: >3 FB Neck ROM: Full    Dental no notable dental hx.    Pulmonary neg pulmonary ROS,    Pulmonary exam normal breath sounds clear to auscultation       Cardiovascular hypertension, Pt. on medications Normal cardiovascular exam Rhythm:Regular Rate:Normal  ECG: NSR, rate 97   Neuro/Psych PSYCHIATRIC DISORDERS Anxiety Depression negative neurological ROS     GI/Hepatic Neg liver ROS, GERD  Medicated and Controlled,IBS (irritable bowel syndrome)   Endo/Other  negative endocrine ROS  Renal/GU negative Renal ROS     Musculoskeletal negative musculoskeletal ROS (+)   Abdominal   Peds  Hematology negative hematology ROS (+)   Anesthesia Other Findings BLADDER CANCER  Reproductive/Obstetrics hcg negative                            Anesthesia Physical Anesthesia Plan  ASA: II  Anesthesia Plan: General   Post-op Pain Management:    Induction: Intravenous  PONV Risk Score and Plan: 4 or greater and Ondansetron, Dexamethasone, Midazolam and Treatment may vary due to age or medical condition  Airway Management Planned: LMA  Additional Equipment:   Intra-op Plan:   Post-operative Plan: Extubation in OR  Informed Consent: I have reviewed the patients History and Physical, chart, labs and discussed the procedure including the risks, benefits and alternatives for the proposed anesthesia with the patient or authorized representative who has indicated his/her understanding and acceptance.     Dental advisory given  Plan Discussed with: CRNA  Anesthesia Plan Comments:        Anesthesia Quick Evaluation

## 2020-03-07 NOTE — Transfer of Care (Signed)
Immediate Anesthesia Transfer of Care Note  Patient: Jacqueline Calhoun  Procedure(s) Performed: CYSTOSCOPY WITH BLADDER  BIOPSY/FULGURATION/ INSTILLATION OF INTRAVESICAL GEMCITABINE (N/A Bladder)  Patient Location: PACU  Anesthesia Type:General  Level of Consciousness: drowsy  Airway & Oxygen Therapy: Patient Spontanous Breathing and Patient connected to nasal cannula oxygen  Post-op Assessment: Report given to RN and Post -op Vital signs reviewed and stable  Post vital signs: Reviewed and stable  Last Vitals:  Vitals Value Taken Time  BP 127/75 03/07/20 1408  Temp    Pulse 80 03/07/20 1409  Resp 14 03/07/20 1409  SpO2 98 % 03/07/20 1409  Vitals shown include unvalidated device data.  Last Pain:  Vitals:   03/07/20 1143  TempSrc: Oral      Patients Stated Pain Goal: 5 (90/93/11 2162)  Complications: No complications documented.

## 2020-03-07 NOTE — Discharge Instructions (Signed)

## 2020-03-10 ENCOUNTER — Encounter (HOSPITAL_BASED_OUTPATIENT_CLINIC_OR_DEPARTMENT_OTHER): Payer: Self-pay | Admitting: Urology

## 2020-03-10 LAB — SURGICAL PATHOLOGY

## 2020-05-09 ENCOUNTER — Other Ambulatory Visit: Payer: Self-pay | Admitting: Family Medicine

## 2020-05-09 DIAGNOSIS — I10 Essential (primary) hypertension: Secondary | ICD-10-CM

## 2020-05-12 ENCOUNTER — Other Ambulatory Visit: Payer: Self-pay | Admitting: Obstetrics & Gynecology

## 2020-05-26 ENCOUNTER — Other Ambulatory Visit: Payer: Self-pay

## 2020-05-26 ENCOUNTER — Ambulatory Visit: Payer: BC Managed Care – PPO | Admitting: Family Medicine

## 2020-05-26 ENCOUNTER — Encounter: Payer: Self-pay | Admitting: Family Medicine

## 2020-05-26 VITALS — BP 118/100 | HR 83 | Temp 98.4°F | Resp 18 | Ht 64.6 in | Wt 167.4 lb

## 2020-05-26 DIAGNOSIS — Z23 Encounter for immunization: Secondary | ICD-10-CM

## 2020-05-26 DIAGNOSIS — I10 Essential (primary) hypertension: Secondary | ICD-10-CM

## 2020-05-26 NOTE — Patient Instructions (Signed)
DASH Eating Plan DASH stands for "Dietary Approaches to Stop Hypertension." The DASH eating plan is a healthy eating plan that has been shown to reduce high blood pressure (hypertension). It may also reduce your risk for type 2 diabetes, heart disease, and stroke. The DASH eating plan may also help with weight loss. What are tips for following this plan?  General guidelines  Avoid eating more than 2,300 mg (milligrams) of salt (sodium) a day. If you have hypertension, you may need to reduce your sodium intake to 1,500 mg a day.  Limit alcohol intake to no more than 1 drink a day for nonpregnant women and 2 drinks a day for men. One drink equals 12 oz of beer, 5 oz of wine, or 1 oz of hard liquor.  Work with your health care provider to maintain a healthy body weight or to lose weight. Ask what an ideal weight is for you.  Get at least 30 minutes of exercise that causes your heart to beat faster (aerobic exercise) most days of the week. Activities may include walking, swimming, or biking.  Work with your health care provider or diet and nutrition specialist (dietitian) to adjust your eating plan to your individual calorie needs. Reading food labels   Check food labels for the amount of sodium per serving. Choose foods with less than 5 percent of the Daily Value of sodium. Generally, foods with less than 300 mg of sodium per serving fit into this eating plan.  To find whole grains, look for the word "whole" as the first word in the ingredient list. Shopping  Buy products labeled as "low-sodium" or "no salt added."  Buy fresh foods. Avoid canned foods and premade or frozen meals. Cooking  Avoid adding salt when cooking. Use salt-free seasonings or herbs instead of table salt or sea salt. Check with your health care provider or pharmacist before using salt substitutes.  Do not fry foods. Cook foods using healthy methods such as baking, boiling, grilling, and broiling instead.  Cook with  heart-healthy oils, such as olive, canola, soybean, or sunflower oil. Meal planning  Eat a balanced diet that includes: ? 5 or more servings of fruits and vegetables each day. At each meal, try to fill half of your plate with fruits and vegetables. ? Up to 6-8 servings of whole grains each day. ? Less than 6 oz of lean meat, poultry, or fish each day. A 3-oz serving of meat is about the same size as a deck of cards. One egg equals 1 oz. ? 2 servings of low-fat dairy each day. ? A serving of nuts, seeds, or beans 5 times each week. ? Heart-healthy fats. Healthy fats called Omega-3 fatty acids are found in foods such as flaxseeds and coldwater fish, like sardines, salmon, and mackerel.  Limit how much you eat of the following: ? Canned or prepackaged foods. ? Food that is high in trans fat, such as fried foods. ? Food that is high in saturated fat, such as fatty meat. ? Sweets, desserts, sugary drinks, and other foods with added sugar. ? Full-fat dairy products.  Do not salt foods before eating.  Try to eat at least 2 vegetarian meals each week.  Eat more home-cooked food and less restaurant, buffet, and fast food.  When eating at a restaurant, ask that your food be prepared with less salt or no salt, if possible. What foods are recommended? The items listed may not be a complete list. Talk with your dietitian about   what dietary choices are best for you. Grains Whole-grain or whole-wheat bread. Whole-grain or whole-wheat pasta. Brown rice. Oatmeal. Quinoa. Bulgur. Whole-grain and low-sodium cereals. Pita bread. Low-fat, low-sodium crackers. Whole-wheat flour tortillas. Vegetables Fresh or frozen vegetables (raw, steamed, roasted, or grilled). Low-sodium or reduced-sodium tomato and vegetable juice. Low-sodium or reduced-sodium tomato sauce and tomato paste. Low-sodium or reduced-sodium canned vegetables. Fruits All fresh, dried, or frozen fruit. Canned fruit in natural juice (without  added sugar). Meat and other protein foods Skinless chicken or turkey. Ground chicken or turkey. Pork with fat trimmed off. Fish and seafood. Egg whites. Dried beans, peas, or lentils. Unsalted nuts, nut butters, and seeds. Unsalted canned beans. Lean cuts of beef with fat trimmed off. Low-sodium, lean deli meat. Dairy Low-fat (1%) or fat-free (skim) milk. Fat-free, low-fat, or reduced-fat cheeses. Nonfat, low-sodium ricotta or cottage cheese. Low-fat or nonfat yogurt. Low-fat, low-sodium cheese. Fats and oils Soft margarine without trans fats. Vegetable oil. Low-fat, reduced-fat, or light mayonnaise and salad dressings (reduced-sodium). Canola, safflower, olive, soybean, and sunflower oils. Avocado. Seasoning and other foods Herbs. Spices. Seasoning mixes without salt. Unsalted popcorn and pretzels. Fat-free sweets. What foods are not recommended? The items listed may not be a complete list. Talk with your dietitian about what dietary choices are best for you. Grains Baked goods made with fat, such as croissants, muffins, or some breads. Dry pasta or rice meal packs. Vegetables Creamed or fried vegetables. Vegetables in a cheese sauce. Regular canned vegetables (not low-sodium or reduced-sodium). Regular canned tomato sauce and paste (not low-sodium or reduced-sodium). Regular tomato and vegetable juice (not low-sodium or reduced-sodium). Pickles. Olives. Fruits Canned fruit in a light or heavy syrup. Fried fruit. Fruit in cream or butter sauce. Meat and other protein foods Fatty cuts of meat. Ribs. Fried meat. Bacon. Sausage. Bologna and other processed lunch meats. Salami. Fatback. Hotdogs. Bratwurst. Salted nuts and seeds. Canned beans with added salt. Canned or smoked fish. Whole eggs or egg yolks. Chicken or turkey with skin. Dairy Whole or 2% milk, cream, and half-and-half. Whole or full-fat cream cheese. Whole-fat or sweetened yogurt. Full-fat cheese. Nondairy creamers. Whipped toppings.  Processed cheese and cheese spreads. Fats and oils Butter. Stick margarine. Lard. Shortening. Ghee. Bacon fat. Tropical oils, such as coconut, palm kernel, or palm oil. Seasoning and other foods Salted popcorn and pretzels. Onion salt, garlic salt, seasoned salt, table salt, and sea salt. Worcestershire sauce. Tartar sauce. Barbecue sauce. Teriyaki sauce. Soy sauce, including reduced-sodium. Steak sauce. Canned and packaged gravies. Fish sauce. Oyster sauce. Cocktail sauce. Horseradish that you find on the shelf. Ketchup. Mustard. Meat flavorings and tenderizers. Bouillon cubes. Hot sauce and Tabasco sauce. Premade or packaged marinades. Premade or packaged taco seasonings. Relishes. Regular salad dressings. Where to find more information:  National Heart, Lung, and Blood Institute: www.nhlbi.nih.gov  American Heart Association: www.heart.org Summary  The DASH eating plan is a healthy eating plan that has been shown to reduce high blood pressure (hypertension). It may also reduce your risk for type 2 diabetes, heart disease, and stroke.  With the DASH eating plan, you should limit salt (sodium) intake to 2,300 mg a day. If you have hypertension, you may need to reduce your sodium intake to 1,500 mg a day.  When on the DASH eating plan, aim to eat more fresh fruits and vegetables, whole grains, lean proteins, low-fat dairy, and heart-healthy fats.  Work with your health care provider or diet and nutrition specialist (dietitian) to adjust your eating plan to your   individual calorie needs. This information is not intended to replace advice given to you by your health care provider. Make sure you discuss any questions you have with your health care provider. Document Revised: 07/29/2017 Document Reviewed: 08/09/2016 Elsevier Patient Education  2020 Elsevier Inc.  

## 2020-05-26 NOTE — Assessment & Plan Note (Signed)
Pt is under a lot of stress---- mass found on her brain and she rushed around today Her bp at home is normally 120/ 80 F/u nurse visit in 2-3 weeks to repeat bp

## 2020-05-26 NOTE — Progress Notes (Signed)
Patient ID: Jacqueline Calhoun, female    DOB: 02-Mar-1976  Age: 44 y.o. MRN: [id number]    Subjective:  Subjective  HPI Jacqueline Calhoun presents for f/u bp---- her bp at home is normally 120/ 80  She is very stressed today   Review of Systems  Constitutional: Negative for appetite change, diaphoresis, fatigue and unexpected weight change.  Eyes: Negative for pain, redness and visual disturbance.  Respiratory: Negative for cough, chest tightness, shortness of breath and wheezing.   Cardiovascular: Negative for chest pain, palpitations and leg swelling.  Endocrine: Negative for cold intolerance, heat intolerance, polydipsia, polyphagia and polyuria.  Genitourinary: Negative for difficulty urinating, dysuria and frequency.  Neurological: Negative for dizziness, light-headedness, numbness and headaches.    History Past Medical History:  Diagnosis Date  . Anxiety   . Bladder tumor   . Depression   . GERD (gastroesophageal reflux disease)   . Hashimoto's thyroiditis    dx 01/ 2016-- endocrinologist-  dr Cruzita Lederer  . History of ovarian cyst   . Hypertension   . IBS (irritable bowel syndrome)    eat gluten free and take probiotic  . Wears glasses     She has a past surgical history that includes No past surgeries; Transurethral resection of bladder tumor (N/A, 07/12/2016); and Cystoscopy with biopsy (N/A, 03/07/2020).   Her family history includes Alzheimer's disease in her paternal grandmother; Anxiety disorder in her father; Cancer in her maternal aunt and paternal aunt; Cancer (age of onset: 61) in her paternal grandfather; Heart disease (age of onset: 16) in her maternal grandfather and maternal grandmother; Hypertension in her father and mother; Pulmonary embolism in her mother; Thyroid disease in her paternal aunt and paternal grandmother.She reports that she has never smoked. She has never used smokeless tobacco. She reports current alcohol use. She reports that she does not use  drugs.  Current Outpatient Medications on File Prior to Visit  Medication Sig Dispense Refill  . diphenhydrAMINE (BENADRYL) 25 MG tablet Take 25 mg by mouth every 6 (six) hours as needed.    . fluticasone (FLONASE) 50 MCG/ACT nasal spray Place 2 sprays into both nostrils at bedtime.     . Ibuprofen (ADVIL) 200 MG CAPS Take by mouth as needed.    . Multiple Vitamin (MULTIVITAMIN) tablet Take 1 tablet by mouth daily.      . Norethindrone-Ethinyl Estradiol-Fe Biphas (LO LOESTRIN FE) 1 MG-10 MCG / 10 MCG tablet Take 1 tablet by mouth daily. (Patient taking differently: Take 1 tablet by mouth at bedtime. ) 3 Package 4  . olmesartan (BENICAR) 40 MG tablet TAKE 1 TABLET(40 MG) BY MOUTH DAILY 90 tablet 1  . omeprazole (PRILOSEC) 20 MG capsule Take 1 capsule (20 mg total) by mouth daily. 90 capsule 3  . OVER THE COUNTER MEDICATION 1 tablet as needed. GABA supplement    . oxybutynin (DITROPAN) 5 MG tablet Take 1 tablet (5 mg total) by mouth every 8 (eight) hours as needed for bladder spasms. 30 tablet 1  . Probiotic Product (PROBIOTIC ACIDOPHILUS PO) Take 1 tablet by mouth daily.      Marland Kitchen spironolactone (ALDACTONE) 25 MG tablet Take 1 tablet (25 mg total) by mouth daily. 90 tablet 1   Current Facility-Administered Medications on File Prior to Visit  Medication Dose Route Frequency Provider Last Rate Last Admin  . gemcitabine (GEMZAR) chemo syringe for bladder instillation 2,000 mg  2,000 mg Bladder Instillation Once Winter, Christopher Aaron, MD         Objective:  Objective  Physical Exam Vitals and nursing note reviewed.  Constitutional:      General: She is not in acute distress.    Appearance: She is well-developed.  HENT:     Right Ear: External ear normal.     Left Ear: External ear normal.     Nose: Nose normal.  Eyes:     Pupils: Pupils are equal, round, and reactive to light.  Cardiovascular:     Rate and Rhythm: Normal rate and regular rhythm.     Heart sounds: Normal heart sounds.  No murmur heard.   Pulmonary:     Effort: Pulmonary effort is normal. No respiratory distress.     Breath sounds: Normal breath sounds. No wheezing or rales.  Chest:     Chest wall: No tenderness.  Musculoskeletal:     Cervical back: Normal range of motion and neck supple.  Neurological:     Mental Status: She is alert and oriented to person, place, and time.  Psychiatric:        Behavior: Behavior normal.        Thought Content: Thought content normal.        Judgment: Judgment normal.    BP (!) 118/100   Pulse 83   Temp 98.4 F (36.9 C) (Oral)   Resp 18   Ht 5' 4.6" (1.641 m)   Wt 167 lb 6.4 oz (75.9 kg)   SpO2 99%   BMI 28.20 kg/m  Wt Readings from Last 3 Encounters:  05/26/20 167 lb 6.4 oz (75.9 kg)  03/07/20 169 lb 12.8 oz (77 kg)  11/20/19 170 lb 9.6 oz (77.4 kg)     Lab Results  Component Value Date   WBC 8.4 11/20/2019   HGB 15.0 03/07/2020   HCT 44.0 03/07/2020   PLT 281.0 11/20/2019   GLUCOSE 89 03/07/2020   CHOL 177 11/20/2019   TRIG 93.0 11/20/2019   HDL 50.20 11/20/2019   LDLCALC 108 (H) 11/20/2019   ALT 46 (H) 11/20/2019   AST 36 11/20/2019   NA 140 03/07/2020   K 3.9 03/07/2020   CL 103 03/07/2020   CREATININE 0.80 03/07/2020   BUN 10 03/07/2020   CO2 26 11/20/2019   TSH 1.08 11/20/2019   INR 1.00 07/12/2016    No results found.   Assessment & Plan:  Plan  I am having Loel Ro maintain her Probiotic Product (PROBIOTIC ACIDOPHILUS PO), multivitamin, Ibuprofen, fluticasone, OVER THE COUNTER MEDICATION, Norethindrone-Ethinyl Estradiol-Fe Biphas, omeprazole, spironolactone, diphenhydrAMINE, oxybutynin, and olmesartan.  No orders of the defined types were placed in this encounter.   Problem List Items Addressed This Visit      Unprioritized   Hypertension    Pt is under a lot of stress---- mass found on her brain and she rushed around today Her bp at home is normally 120/ 80 F/u nurse visit in 2-3 weeks to repeat bp       Other  Visit Diagnoses    Need for influenza vaccination    -  Primary   Relevant Orders   Flu Vaccine QUAD 36+ mos IM (Completed)      Follow-up: Return in about 3 weeks (around 06/16/2020), or if symptoms worsen or fail to improve, for hypertension   nurse visit ok.  Ann Held, DO

## 2020-07-15 ENCOUNTER — Ambulatory Visit (INDEPENDENT_AMBULATORY_CARE_PROVIDER_SITE_OTHER): Payer: BC Managed Care – PPO

## 2020-07-15 ENCOUNTER — Other Ambulatory Visit: Payer: Self-pay

## 2020-07-15 VITALS — BP 112/80 | HR 82

## 2020-07-15 DIAGNOSIS — I10 Essential (primary) hypertension: Secondary | ICD-10-CM

## 2020-07-15 NOTE — Progress Notes (Signed)
Pt here for Blood pressure check per Dr. Etter Sjogren  Pt currently takes:olmesartan 40 mg, spironolactone 25 mg  BP Readings from Last 3 Encounters:  05/26/20 (!) 118/100  03/07/20 (!) 148/102  11/20/19 130/82    Pt reports compliance with medication.  BP today @ = 112/80 HR = 82

## 2020-08-05 ENCOUNTER — Encounter: Payer: Self-pay | Admitting: Obstetrics & Gynecology

## 2020-08-08 ENCOUNTER — Other Ambulatory Visit: Payer: Self-pay | Admitting: Family Medicine

## 2020-08-08 DIAGNOSIS — I10 Essential (primary) hypertension: Secondary | ICD-10-CM

## 2020-09-09 ENCOUNTER — Encounter: Payer: Self-pay | Admitting: Internal Medicine

## 2020-09-09 ENCOUNTER — Other Ambulatory Visit: Payer: Self-pay

## 2020-09-09 ENCOUNTER — Ambulatory Visit: Payer: BC Managed Care – PPO | Admitting: Internal Medicine

## 2020-09-09 VITALS — BP 140/100 | HR 70 | Ht 64.0 in | Wt 167.8 lb

## 2020-09-09 DIAGNOSIS — E063 Autoimmune thyroiditis: Secondary | ICD-10-CM

## 2020-09-09 LAB — T3, FREE: T3, Free: 3.7 pg/mL (ref 2.3–4.2)

## 2020-09-09 LAB — TSH: TSH: 1.38 u[IU]/mL (ref 0.35–4.50)

## 2020-09-09 LAB — T4, FREE: Free T4: 0.66 ng/dL (ref 0.60–1.60)

## 2020-09-09 NOTE — Progress Notes (Signed)
Patient ID: Jacqueline Calhoun, female   DOB: 15-Jun-1976, 45 y.o.   MRN: [id number]  This visit occurred during the SARS-CoV-2 public health emergency.  Safety protocols were in place, including screening questions prior to the visit, additional usage of staff PPE, and extensive cleaning of exam room while observing appropriate contact time as indicated for disinfecting solutions.   HPI  Jacqueline Calhoun is a 45 y.o.-year-old female,r-old female, returning for f/u for euthyroid Hashimoto's thyroiditis. Last visit 1 year ago.  She has a history of bladder cancer diagnosed in 2018.  This was low-grade, with no extension, no metastases.   She had another bladder tumor over the summer 2021 >> resected >> then had 6 weeks of BCG. She is followed closely by Urology with every 3 month cystoscopies.  Reviewed history: Pt. has been dx with Hashimoto's thyroiditis in 08/2014 during investigation for several complaints-see below:  She described CP in 2012  >> anxiety, HTN >> started anxiety meds and BP meds, but sxs not better. She also had depression; last year also started to have night sweats, now better. She wakes up early in am, does not sleep well; also has dry skin. Menses: after her baby in 2007 >> menorrhagia >> saw ObGyn >> different images tests >> thick endometrium. In 2014 she had an ovarian cyst >> pain >> started OCPs.   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  When I first saw the patient, she was describing: Fatigue More anxiety Insomnia Hot flashes at night Dry patches of skin These have improved.  She continues to have hair loss (but improved) and dry eyes, which are chronic for her.  TFTs were reviewed and they were normal: Lab Results  Component Value Date   TSH 1.08 11/20/2019   TSH 1.62 09/10/2019   TSH 1.10 09/26/2018   TSH 1.24 08/01/2018   TSH 1.05 08/02/2017   TSH 0.90 01/06/2017   TSH 0.76 06/03/2016   TSH 1.03 12/01/2015   TSH 0.79 04/14/2015   TSH 1.07 10/08/2014   FREET4 0.61  09/10/2019   FREET4 0.60 08/01/2018   FREET4 0.66 08/02/2017   FREET4 0.69 06/03/2016   FREET4 0.69 12/01/2015   FREET4 0.71 04/14/2015   FREET4 0.78 10/08/2014   FREET4 1.13 10/30/2012  04/23/2014 (ObGyn): TSH 0.93, fT4 0.96, fT3 3.2  Her ATA antibodies normalized: Lab Results  Component Value Date   THGAB 1 09/10/2019   THGAB <1 08/01/2018   THGAB <1 01/31/2018   THGAB 4 (H) 08/02/2017   THGAB 3 (H) 06/03/2016   THGAB 3 (H) 04/14/2015   THGAB 5 (H) 09/23/2014   Her TPO antibodies are normal: Component     Latest Ref Rng & Units 09/23/2014 04/14/2015  Thyroperoxidase Ab SerPl-aCnc     <9 IU/mL 6 6   Pt denies: - feeling nodules in neck - hoarseness - dysphagia - choking - SOB with lying down  She has a history of HTN and elevated LFTs, which normalized after starting a gluten-free diet 6 years ago.  She continues on this diet.  In 05/2019, LFTs were slightly higher again, unclear why.  She stopped Omeprazole at the beginning of the year.  ROS: Constitutional: no weight gain/no weight loss, no fatigue, no subjective hyperthermia, no subjective hypothermia Eyes: no blurry vision, + xerophthalmia ENT: no sore throat, + see HPI Cardiovascular: no CP/no SOB/no palpitations/no leg swelling Respiratory: no cough/no SOB/no wheezing Gastrointestinal: no N/no V/no D/no C/no acid reflux Musculoskeletal: no muscle aches/no joint aches Skin: no rashes, +  improved hair loss Neurological: no tremors/no numbness/no tingling/no dizziness  I reviewed pt's medications, allergies, PMH, social hx, family hx, and changes were documented in the history of present illness. Otherwise, unchanged from my initial visit note.  Past Medical History:  Diagnosis Date  . Anxiety   . Bladder tumor   . Depression   . GERD (gastroesophageal reflux disease)   . Hashimoto's thyroiditis    dx 01/ 2016-- endocrinologist-  dr Cruzita Lederer  . History of ovarian cyst   . Hypertension   . IBS (irritable  bowel syndrome)    eat gluten free and take probiotic  . Wears glasses    Past Surgical History:  Procedure Laterality Date  . CYSTOSCOPY WITH BIOPSY N/A 03/07/2020   Procedure: CYSTOSCOPY WITH BLADDER  BIOPSY/FULGURATION/ INSTILLATION OF INTRAVESICAL GEMCITABINE;  Surgeon: Ceasar Mons, MD;  Location: [facility];  Service: Urology;  Laterality: N/A;  ONLY NEEDS 30 MIN  . NO PAST SURGERIES    . TRANSURETHRAL RESECTION OF BLADDER TUMOR N/A 07/12/2016   Procedure: TRANSURETHRAL RESECTION OF BLADDER TUMOR (TURBT);  Surgeon: Nickie Retort, MD;  Location: [facility];  Service: Urology;  Laterality: N/A;     History   Social History  . Marital Status: Married    Spouse Name: N/A    Number of Children: 1   Occupational History  . teacher   Social History Main Topics  . Smoking status: Never Smoker   . Smokeless tobacco: Never Used  . Alcohol Use: 1.2 oz/week    1-2 Shots of liquor per week  . Drug Use: No  . Sexual Activity: Yes    Birth Control/ Protection: Condom   Current Outpatient Medications on File Prior to Visit  Medication Sig Dispense Refill  . diphenhydrAMINE (BENADRYL) 25 MG tablet Take 25 mg by mouth every 6 (six) hours as needed.    . fluticasone (FLONASE) 50 MCG/ACT nasal spray Place 2 sprays into both nostrils at bedtime.     . Ibuprofen (ADVIL) 200 MG CAPS Take by mouth as needed.    . Multiple Vitamin (MULTIVITAMIN) tablet Take 1 tablet by mouth daily.      . Norethindrone-Ethinyl Estradiol-Fe Biphas (LO LOESTRIN FE) 1 MG-10 MCG / 10 MCG tablet Take 1 tablet by mouth daily. (Patient taking differently: Take 1 tablet by mouth at bedtime. ) 3 Package 4  . olmesartan (BENICAR) 40 MG tablet TAKE 1 TABLET(40 MG) BY MOUTH DAILY 90 tablet 1  . omeprazole (PRILOSEC) 20 MG capsule Take 1 capsule (20 mg total) by mouth daily. 90 capsule 3  . OVER THE COUNTER MEDICATION 1 tablet as needed. GABA supplement    . oxybutynin  (DITROPAN) 5 MG tablet Take 1 tablet (5 mg total) by mouth every 8 (eight) hours as needed for bladder spasms. 30 tablet 1  . Probiotic Product (PROBIOTIC ACIDOPHILUS PO) Take 1 tablet by mouth daily.      Marland Kitchen spironolactone (ALDACTONE) 25 MG tablet TAKE 1 TABLET(25 MG) BY MOUTH DAILY 90 tablet 1   Current Facility-Administered Medications on File Prior to Visit  Medication Dose Route Frequency Provider Last Rate Last Admin  . gemcitabine (GEMZAR) chemo syringe for bladder instillation 2,000 mg  2,000 mg Bladder Instillation Once Winter, Christopher Aaron, MD       No Known Allergies   Family History  Problem Relation Age of Onset  . Hypertension Mother   . Pulmonary embolism Mother   . Hypertension Father   . Anxiety disorder  Father   . Heart disease Maternal Grandmother 72       MI  . Heart disease Maternal Grandfather 24       MI  . Thyroid disease Paternal Aunt   . Cancer Paternal Aunt        breast  . Cancer Maternal Aunt        colon  . Alzheimer's disease Paternal Grandmother   . Thyroid disease Paternal Grandmother   . Cancer Paternal Grandfather 50    PE: BP (!) 140/100   Pulse 70   Ht 5\' 4"  (1.626 m)   Wt 167 lb 12.8 oz (76.1 kg)   SpO2 99%   BMI 28.80 kg/m  Body mass index is 28.8 kg/m. Wt Readings from Last 3 Encounters:  09/09/20 167 lb 12.8 oz (76.1 kg)  05/26/20 167 lb 6.4 oz (75.9 kg)  03/07/20 169 lb 12.8 oz (77 kg)   Constitutional: Slightly overweight, in NAD Eyes: PERRLA, EOMI, no exophthalmos ENT: moist mucous membranes, no thyromegaly, no cervical lymphadenopathy Cardiovascular: RRR, No MRG Respiratory: CTA B Gastrointestinal: abdomen soft, NT, ND, BS+ Musculoskeletal: no deformities, strength intact in all 4 Skin: moist, warm, no rashes Neurological: no tremor with outstretched hands, DTR normal in all 4  ASSESSMENT: 1. Hashimoto thyroiditis -Normal TPO antibodies -Slightly high antithyroglobulin antibodies  PLAN: 1. Hashimoto  thyroiditis -She remains euthyroid -In the past, she had symptoms that could have been consistent with hypothyroidism including fatigue, anxiety, heat and cold intolerance, but these have improved.  At last visit, she had xerophthalmia and hair loss and had a 7 pound weight gain but after the starting of the coronavirus pandemic and working from home.  She improved her diet and started exercise and continue the gluten-free diet.  She started to feel better after changing her diet.  Of note, she has a history of resection of a low-grade, papillary bladder carcinoma , noninvasive.  She had another tumor discovered over the summer and had surgery for this. -We reviewed her most recent TFTs which were normal.  Her ATA antibodies have normalized.  From the Hashimoto's thyroiditis point of view, she appears to be in remission -At today's visit, we will repeat her TFTs and ATA (she prefers to have this checked at every visit.  We discussed that if the antibodies increase, we can start selenium. Otherwise, treatment is necessary only if her TFTs become abnormal. -We did discuss about the possibility of needing to start levothyroxine and I also advised her how to take this correctly in case we need to start. -I will see her back in a year but sooner for labs if they return abnormal today  Component     Latest Ref Rng & Units 09/09/2020  Triiodothyronine,Free,Serum     2.3 - 4.2 pg/mL 3.7  T4,Free(Direct)     0.60 - 1.60 ng/dL 0.66  TSH     0.35 - 4.50 uIU/mL 1.38  Thyroglobulin Ab     < or = 1 IU/mL <1  Normal thyroid tests.  Philemon Kingdom, MD PhD [facility] Endocrinology

## 2020-09-09 NOTE — Patient Instructions (Signed)
Please stop at the lab.  Please come back for a follow-up appointment in 1 year.  

## 2020-09-10 LAB — THYROGLOBULIN ANTIBODY: Thyroglobulin Ab: <1 IU/mL (ref <=1)

## 2020-09-29 ENCOUNTER — Encounter: Payer: BC Managed Care – PPO | Admitting: Obstetrics & Gynecology

## 2020-09-30 ENCOUNTER — Ambulatory Visit: Payer: BC Managed Care – PPO | Admitting: Obstetrics & Gynecology

## 2020-10-26 ENCOUNTER — Other Ambulatory Visit: Payer: Self-pay | Admitting: Obstetrics & Gynecology

## 2020-10-28 NOTE — Telephone Encounter (Signed)
Annal exam scheduled on 12/02/20

## 2020-11-06 ENCOUNTER — Other Ambulatory Visit: Payer: Self-pay | Admitting: Family Medicine

## 2020-11-06 DIAGNOSIS — K219 Gastro-esophageal reflux disease without esophagitis: Secondary | ICD-10-CM

## 2020-11-06 DIAGNOSIS — I10 Essential (primary) hypertension: Secondary | ICD-10-CM

## 2020-11-27 ENCOUNTER — Telehealth: Payer: BC Managed Care – PPO | Admitting: Emergency Medicine

## 2020-11-27 DIAGNOSIS — J329 Chronic sinusitis, unspecified: Secondary | ICD-10-CM | POA: Diagnosis not present

## 2020-11-27 MED ORDER — AMOXICILLIN-POT CLAVULANATE 875-125 MG PO TABS
1.0000 | ORAL_TABLET | Freq: Two times a day (BID) | ORAL | 0 refills | Status: DC
Start: 2020-11-27 — End: 2021-01-19

## 2020-11-27 NOTE — Progress Notes (Signed)

## 2020-12-02 ENCOUNTER — Other Ambulatory Visit: Payer: Self-pay

## 2020-12-02 ENCOUNTER — Ambulatory Visit: Payer: BC Managed Care – PPO | Admitting: Obstetrics & Gynecology

## 2020-12-02 ENCOUNTER — Encounter: Payer: Self-pay | Admitting: Obstetrics & Gynecology

## 2020-12-02 VITALS — BP 140/90 | Ht 64.5 in | Wt 175.0 lb

## 2020-12-02 DIAGNOSIS — Z01419 Encounter for gynecological examination (general) (routine) without abnormal findings: Secondary | ICD-10-CM | POA: Diagnosis not present

## 2020-12-02 DIAGNOSIS — Z3041 Encounter for surveillance of contraceptive pills: Secondary | ICD-10-CM | POA: Diagnosis not present

## 2020-12-02 DIAGNOSIS — E663 Overweight: Secondary | ICD-10-CM | POA: Diagnosis not present

## 2020-12-02 MED ORDER — LO LOESTRIN FE 1 MG-10 MCG / 10 MCG PO TABS: 1.0000 | ORAL_TABLET | Freq: Every day | ORAL | 4 refills | Status: DC

## 2020-12-02 NOTE — Progress Notes (Signed)
Jacqueline Calhoun [birth year] [id number]   History:    45 y.o. G1P1L1 Married. Daughter is 93 yo.  Moving back to [place].  ES:[id number] presenting for annual gyn exam   YBW:LSLH on Lo Lo-Estrin.  No BTB. No pelvic pain currently. No pain with IC, but rarely sexually active. Normal vaginal secretions. Breasts normal. BMI 29.57. Urine/BMs wnl. Fasting Health Labs with Fam MD.  Past medical history,surgical history, family history and social history were all reviewed and documented in the EPIC chart.  Gynecologic History Patient's last menstrual period was 11/29/2020.  Obstetric History OB History  Gravida Para Term Preterm AB Living  1 1     0 1  SAB IAB Ectopic Multiple Live Births      0        # Outcome Date GA Lbr Len/2nd Weight Sex Delivery Anes PTL Lv  1 Para              ROS: A ROS was performed and pertinent positives and negatives are included in the history.  GENERAL: No fevers or chills. HEENT: No change in vision, no earache, sore throat or sinus congestion. NECK: No pain or stiffness. CARDIOVASCULAR: No chest pain or pressure. No palpitations. PULMONARY: No shortness of breath, cough or wheeze. GASTROINTESTINAL: No abdominal pain, nausea, vomiting or diarrhea, melena or bright red blood per rectum. GENITOURINARY: No urinary frequency, urgency, hesitancy or dysuria. MUSCULOSKELETAL: No joint or muscle pain, no back pain, no recent trauma. DERMATOLOGIC: No rash, no itching, no lesions. ENDOCRINE: No polyuria, polydipsia, no heat or cold intolerance. No recent change in weight. HEMATOLOGICAL: No anemia or easy bruising or bleeding. NEUROLOGIC: No headache, seizures, numbness, tingling or weakness. PSYCHIATRIC: No depression, no loss of interest in normal activity or change in sleep pattern.     Exam:   BP 140/90   Ht 5' 4.5" (1.638 m)   Wt 175 lb (79.4 kg)   LMP 11/29/2020 Comment: pill  BMI 29.57 kg/m   Body mass index is 29.57 kg/m.  General  appearance : Well developed well nourished female. No acute distress HEENT: Eyes: no retinal hemorrhage or exudates,  Neck supple, trachea midline, no carotid bruits, no thyroidmegaly Lungs: Clear to auscultation, no rhonchi or wheezes, or rib retractions  Heart: Regular rate and rhythm, no murmurs or gallops Breast:Examined in sitting and supine position were symmetrical in appearance, no palpable masses or tenderness,  no skin retraction, no nipple inversion, no nipple discharge, no skin discoloration, no axillary or supraclavicular lymphadenopathy Abdomen: no palpable masses or tenderness, no rebound or guarding Extremities: no edema or skin discoloration or tenderness  Pelvic: Vulva: Normal             Vagina: No gross lesions or discharge  Cervix: No gross lesions or discharge.  Pap reflex done.  Uterus  AV, normal size, shape and consistency, non-tender and mobile  Adnexa  Without masses or tenderness  Anus: Normal   Assessment/Plan:  45 y.o. female for annual exam   1. Encounter for routine gynecological examination with Papanicolaou smear of cervix Normal gynecologic exam.  Pap reflex done.  Breast exam normal.  Screening mammogram December 2021 was negative.  Health labs with family physician.  2. Encounter for surveillance of contraceptive pills Well on the generic of Lo Loestrin FE.  No contraindication to continue.  Prescription sent to pharmacy.  3. Overweight (BMI 25.0-29.9) Recommend a lower calorie/carb diet.  Intermittent fasting recommended.  Aerobic activities 5 times a week and light  weightlifting every 2 days.  Other orders - fexofenadine (ALLEGRA) 60 MG tablet; Take 60 mg by mouth 2 (two) times daily. - Norethindrone-Ethinyl Estradiol-Fe Biphas (LO LOESTRIN FE) 1 MG-10 MCG / 10 MCG tablet; Take 1 tablet by mouth at bedtime.  Princess Bruins MD, 3:23 PM 12/02/2020

## 2020-12-03 LAB — PAP IG W/ RFLX HPV ASCU

## 2020-12-08 ENCOUNTER — Encounter: Payer: Self-pay | Admitting: *Deleted

## 2020-12-09 ENCOUNTER — Encounter: Payer: Self-pay | Admitting: Obstetrics & Gynecology

## 2020-12-10 ENCOUNTER — Other Ambulatory Visit: Payer: Self-pay | Admitting: Family Medicine

## 2020-12-10 DIAGNOSIS — I10 Essential (primary) hypertension: Secondary | ICD-10-CM

## 2020-12-28 ENCOUNTER — Other Ambulatory Visit: Payer: Self-pay | Admitting: Family Medicine

## 2020-12-28 DIAGNOSIS — I10 Essential (primary) hypertension: Secondary | ICD-10-CM

## 2020-12-29 MED ORDER — OLMESARTAN MEDOXOMIL 40 MG PO TABS: 40.0000 mg | ORAL_TABLET | Freq: Every day | ORAL | 0 refills | Status: DC

## 2021-01-01 ENCOUNTER — Other Ambulatory Visit: Payer: Self-pay

## 2021-01-01 DIAGNOSIS — I10 Essential (primary) hypertension: Secondary | ICD-10-CM

## 2021-01-01 MED ORDER — OLMESARTAN MEDOXOMIL 40 MG PO TABS: 40.0000 mg | ORAL_TABLET | Freq: Every day | ORAL | 0 refills | Status: DC

## 2021-01-19 ENCOUNTER — Encounter: Payer: Self-pay | Admitting: Family Medicine

## 2021-01-19 ENCOUNTER — Other Ambulatory Visit: Payer: Self-pay

## 2021-01-19 ENCOUNTER — Ambulatory Visit (INDEPENDENT_AMBULATORY_CARE_PROVIDER_SITE_OTHER): Payer: BC Managed Care – PPO | Admitting: Family Medicine

## 2021-01-19 VITALS — BP 120/80 | HR 83 | Temp 98.7°F | Resp 18 | Ht 64.5 in | Wt 170.2 lb

## 2021-01-19 DIAGNOSIS — Z0000 Encounter for general adult medical examination without abnormal findings: Secondary | ICD-10-CM

## 2021-01-19 DIAGNOSIS — Z23 Encounter for immunization: Secondary | ICD-10-CM

## 2021-01-19 DIAGNOSIS — I10 Essential (primary) hypertension: Secondary | ICD-10-CM

## 2021-01-19 DIAGNOSIS — F430 Acute stress reaction: Secondary | ICD-10-CM

## 2021-01-19 DIAGNOSIS — F419 Anxiety disorder, unspecified: Secondary | ICD-10-CM

## 2021-01-19 MED ORDER — OLMESARTAN MEDOXOMIL 40 MG PO TABS: 40.0000 mg | ORAL_TABLET | Freq: Every day | ORAL | 3 refills | Status: DC

## 2021-01-19 MED ORDER — ALPRAZOLAM 0.25 MG PO TABS: 0.2500 mg | ORAL_TABLET | Freq: Two times a day (BID) | ORAL | 0 refills | Status: AC | PRN

## 2021-01-19 NOTE — Patient Instructions (Signed)
Preventive Care 47-47 Years Old, Female Preventive care refers to lifestyle choices and visits with your health care provider that can promote health and wellness. This includes:  A yearly physical exam. This is also called an annual wellness visit.  Regular dental and eye exams.  Immunizations.  Screening for certain conditions.  Healthy lifestyle choices, such as: ? Eating a healthy diet. ? Getting regular exercise. ? Not using drugs or products that contain nicotine and tobacco. ? Limiting alcohol use. What can I expect for my preventive care visit? Physical exam Your health care provider will check your:  Height and weight. These may be used to calculate your BMI (body mass index). BMI is a measurement that tells if you are at a healthy weight.  Heart rate and blood pressure.  Body temperature.  Skin for abnormal spots. Counseling Your health care provider may ask you questions about your:  Past medical problems.  Family's medical history.  Alcohol, tobacco, and drug use.  Emotional well-being.  Home life and relationship well-being.  Sexual activity.  Diet, exercise, and sleep habits.  Work and work Statistician.  Access to firearms.  Method of birth control.  Menstrual cycle.  Pregnancy history. What immunizations do I need? Vaccines are usually given at various ages, according to a schedule. Your health care provider will recommend vaccines for you based on your age, medical history, and lifestyle or other factors, such as travel or where you work.   What tests do I need? Blood tests  Lipid and cholesterol levels. These may be checked every 5 years, or more often if you are over 47 years old.  Hepatitis C test.  Hepatitis B test. Screening  Lung cancer screening. You may have this screening every year starting at age 47 if you have a 30-pack-year history of smoking and currently smoke or have quit within the past 15 years.  Colorectal cancer  screening. ? All adults should have this screening starting at age 47 and continuing until age 17. ? Your health care provider may recommend screening at age 47 if you are at increased risk. ? You will have tests every 1-10 years, depending on your results and the type of screening test.  Diabetes screening. ? This is done by checking your blood sugar (glucose) after you have not eaten for a while (fasting). ? You may have this done every 1-3 years.  Mammogram. ? This may be done every 1-2 years. ? Talk with your health care provider about when you should start having regular mammograms. This may depend on whether you have a family history of breast cancer.  BRCA-related cancer screening. This may be done if you have a family history of breast, ovarian, tubal, or peritoneal cancers.  Pelvic exam and Pap test. ? This may be done every 3 years starting at age 47. ? Starting at age 11, this may be done every 5 years if you have a Pap test in combination with an HPV test. Other tests  STD (sexually transmitted disease) testing, if you are at risk.  Bone density scan. This is done to screen for osteoporosis. You may have this scan if you are at high risk for osteoporosis. Talk with your health care provider about your test results, treatment options, and if necessary, the need for more tests. Follow these instructions at home: Eating and drinking  Eat a diet that includes fresh fruits and vegetables, whole grains, lean protein, and low-fat dairy products.  Take vitamin and mineral supplements  as recommended by your health care provider.  Do not drink alcohol if: ? Your health care provider tells you not to drink. ? You are pregnant, may be pregnant, or are planning to become pregnant.  If you drink alcohol: ? Limit how much you have to 0-1 drink a day. ? Be aware of how much alcohol is in your drink. In the [place] one drink equals one 12 oz bottle of beer (355 mL), one 5 oz glass of  wine (148 mL), or one 1 oz glass of hard liquor (44 mL).   Lifestyle  Take daily care of your teeth and gums. Brush your teeth every morning and night with fluoride toothpaste. Floss one time each day.  Stay active. Exercise for at least 30 minutes 5 or more days each week.  Do not use any products that contain nicotine or tobacco, such as cigarettes, e-cigarettes, and chewing tobacco. If you need help quitting, ask your health care provider.  Do not use drugs.  If you are sexually active, practice safe sex. Use a condom or other form of protection to prevent STIs (sexually transmitted infections).  If you do not wish to become pregnant, use a form of birth control. If you plan to become pregnant, see your health care provider for a prepregnancy visit.  If told by your health care provider, take low-dose aspirin daily starting at age 47.  Find healthy ways to cope with stress, such as: ? Meditation, yoga, or listening to music. ? Journaling. ? Talking to a trusted person. ? Spending time with friends and family. Safety  Always wear your seat belt while driving or riding in a vehicle.  Do not drive: ? If you have been drinking alcohol. Do not ride with someone who has been drinking. ? When you are tired or distracted. ? While texting.  Wear a helmet and other protective equipment during sports activities.  If you have firearms in your house, make sure you follow all gun safety procedures. What's next?  Visit your health care provider once a year for an annual wellness visit.  Ask your health care provider how often you should have your eyes and teeth checked.  Stay up to date on all vaccines. This information is not intended to replace advice given to you by your health care provider. Make sure you discuss any questions you have with your health care provider. Document Revised: 05/20/2020 Document Reviewed: 04/27/2018 Elsevier Patient Education  2021 Elsevier Inc.  

## 2021-01-19 NOTE — Progress Notes (Signed)
Patient ID: Jacqueline Calhoun, female    DOB: [birth year]  Age: 45 y.o. MRN: [id number]    Subjective:  Subjective  HPI Jacqueline Calhoun presents for a comprehensive physical examination today. She complains of stress. She endorses taking Xanax, when her mother died and is requesting medications to manage her stress. She states that she has been dealing with a lot of stress since, she is moving to [place] in July. She notes that she is hesitant about the decision. She endorses being on a gluten free diet. She reports that when she consumes gluten, she felt sick. She states that her allergy is managed with 60 mg Allegra PO Daily. She endorses taking 40 mg Benicar PO Daily for her dx of hypertension.  BP Readings from Last 3 Encounters:  01/19/21 120/80  12/02/20 140/90  09/09/20 (!) 140/100  She denies any chest pain, SOB, fever, abdominal pain, cough, chills, sore throat, dysuria, urinary incontinence, back pain, HA, or N/V/D at this time. Pt requests for a tetanus vaccine.  Review of Systems  Constitutional: Negative for chills, fatigue and fever.  HENT: Negative for ear pain, rhinorrhea, sinus pressure, sinus pain, sore throat and tinnitus.   Eyes: Negative for pain.  Respiratory: Negative for cough, shortness of breath and wheezing.   Cardiovascular: Negative for chest pain.  Gastrointestinal: Negative for abdominal pain, anal bleeding, constipation, diarrhea, nausea and vomiting.  Genitourinary: Negative for flank pain.  Musculoskeletal: Negative for back pain and neck pain.  Skin: Negative for rash.  Neurological: Negative for seizures, weakness, light-headedness, numbness and headaches.  Psychiatric/Behavioral: Negative for decreased concentration, self-injury, sleep disturbance and suicidal ideas. The patient is nervous/anxious.        (+) stress     History Past Medical History:  Diagnosis Date  . Anxiety   . Bladder tumor   . Depression   . GERD (gastroesophageal reflux disease)    . Hashimoto's thyroiditis    dx 01/ 2016-- endocrinologist-  dr Cruzita Lederer  . History of ovarian cyst   . Hypertension   . IBS (irritable bowel syndrome)    eat gluten free and take probiotic  . Wears glasses     She has a past surgical history that includes No past surgeries; Transurethral resection of bladder tumor (N/A, 07/12/2016); Cystoscopy with biopsy (N/A, 03/07/2020); and Transurethral resection of bladder tumor (02/2020).   Her family history includes Alzheimer's disease in her paternal grandmother; Anxiety disorder in her father; Cancer in her maternal aunt and paternal aunt; Cancer (age of onset: 42) in her paternal grandfather; Heart disease (age of onset: 68) in her maternal grandfather and maternal grandmother; Hypertension in her father and mother; Pulmonary embolism in her mother; Thyroid disease in her paternal aunt and paternal grandmother.She reports that she has never smoked. She has never used smokeless tobacco. She reports current alcohol use. She reports that she does not use drugs.  Current Outpatient Medications on File Prior to Visit  Medication Sig Dispense Refill  . diphenhydrAMINE (BENADRYL) 25 MG tablet Take 25 mg by mouth every 6 (six) hours as needed.    . fexofenadine (ALLEGRA) 60 MG tablet Take 60 mg by mouth 2 (two) times daily.    . fluticasone (FLONASE) 50 MCG/ACT nasal spray Place 2 sprays into both nostrils at bedtime.     . Ibuprofen 200 MG CAPS Take by mouth as needed.    . Multiple Vitamin (MULTIVITAMIN) tablet Take 1 tablet by mouth daily.    . Norethindrone-Ethinyl Estradiol-Fe Biphas (LO LOESTRIN FE)  1 MG-10 MCG / 10 MCG tablet Take 1 tablet by mouth at bedtime. 84 tablet 4  . OVER THE COUNTER MEDICATION 1 tablet as needed. GABA supplement    . Probiotic Product (PROBIOTIC ACIDOPHILUS PO) Take 1 tablet by mouth daily.    Marland Kitchen spironolactone (ALDACTONE) 25 MG tablet TAKE 1 TABLET(25 MG) BY MOUTH DAILY 90 tablet 1   Current Facility-Administered  Medications on File Prior to Visit  Medication Dose Route Frequency Provider Last Rate Last Admin  . gemcitabine (GEMZAR) chemo syringe for bladder instillation 2,000 mg  2,000 mg Bladder Instillation Once Winter, Conception Oms, MD         Objective:  Objective  Physical Exam Vitals and nursing note reviewed.  Constitutional:      General: She is not in acute distress.    Appearance: Normal appearance. She is well-developed. She is not ill-appearing.  HENT:     Head: Normocephalic and atraumatic.     Right Ear: External ear normal.     Left Ear: External ear normal.     Nose: Nose normal.  Eyes:     General:        Right eye: No discharge.        Left eye: No discharge.     Extraocular Movements: Extraocular movements intact.     Pupils: Pupils are equal, round, and reactive to light.  Cardiovascular:     Rate and Rhythm: Normal rate and regular rhythm.     Pulses: Normal pulses.     Heart sounds: Normal heart sounds. No murmur heard. No friction rub. No gallop.   Pulmonary:     Effort: Pulmonary effort is normal. No respiratory distress.     Breath sounds: Normal breath sounds. No stridor. No wheezing, rhonchi or rales.  Chest:     Chest wall: No tenderness.  Abdominal:     General: Bowel sounds are normal. There is no distension.     Palpations: Abdomen is soft. There is no mass.     Tenderness: There is no abdominal tenderness. There is no guarding or rebound.     Hernia: No hernia is present.  Genitourinary:    Comments: gyn Musculoskeletal:        General: Normal range of motion.     Cervical back: Normal range of motion and neck supple.     Right lower leg: No edema.     Left lower leg: No edema.  Skin:    General: Skin is warm and dry.  Neurological:     Mental Status: She is alert and oriented to person, place, and time.  Psychiatric:        Behavior: Behavior normal.        Thought Content: Thought content normal.    BP 120/80 (BP Location: Right  Arm, Patient Position: Sitting, Cuff Size: Normal)   Pulse 83   Temp 98.7 F (37.1 C) (Oral)   Resp 18   Ht 5' 4.5" (1.638 m)   Wt 170 lb 3.2 oz (77.2 kg)   SpO2 96%   BMI 28.76 kg/m  Wt Readings from Last 3 Encounters:  01/19/21 170 lb 3.2 oz (77.2 kg)  12/02/20 175 lb (79.4 kg)  09/09/20 167 lb 12.8 oz (76.1 kg)     Lab Results  Component Value Date   WBC 8.4 11/20/2019   HGB 15.0 03/07/2020   HCT 44.0 03/07/2020   PLT 281.0 11/20/2019   GLUCOSE 89 03/07/2020   CHOL 177 11/20/2019  TRIG 93.0 11/20/2019   HDL 50.20 11/20/2019   LDLCALC 108 (H) 11/20/2019   ALT 46 (H) 11/20/2019   AST 36 11/20/2019   NA 140 03/07/2020   K 3.9 03/07/2020   CL 103 03/07/2020   CREATININE 0.80 03/07/2020   BUN 10 03/07/2020   CO2 26 11/20/2019   TSH 1.38 09/09/2020   INR 1.00 07/12/2016    No results found.   Assessment & Plan:  Plan   Meds ordered this encounter  Medications  . olmesartan (BENICAR) 40 MG tablet    Sig: Take 1 tablet (40 mg total) by mouth daily.    Dispense:  90 tablet    Refill:  3  . ALPRAZolam (XANAX) 0.25 MG tablet    Sig: Take 1 tablet (0.25 mg total) by mouth 2 (two) times daily as needed for anxiety.    Dispense:  20 tablet    Refill:  0    Problem List Items Addressed This Visit      Unprioritized   Anxiety    Sl worse due to upcoming move-- xanax prn rx sent in  F/u prn      Relevant Medications   ALPRAZolam (XANAX) 0.25 MG tablet   Essential hypertension    Well controlled, no changes to meds. Encouraged heart healthy diet such as the DASH diet and exercise as tolerated.       Relevant Medications   olmesartan (BENICAR) 40 MG tablet   Other Relevant Orders   TSH   Lipid panel   CBC with Differential/Platelet   Comprehensive metabolic panel   Hypertension    Well controlled, no changes to meds. Encouraged heart healthy diet such as the DASH diet and exercise as tolerated.       Relevant Medications   olmesartan (BENICAR) 40 MG  tablet   Preventative health care - Primary    ghm utd Check labs  See avs      Relevant Orders   TSH   Lipid panel   CBC with Differential/Platelet   Comprehensive metabolic panel   Stress reaction   Relevant Medications   ALPRAZolam (XANAX) 0.25 MG tablet    Other Visit Diagnoses    Need for tetanus booster       Relevant Orders   Tdap vaccine greater than or equal to 7yo IM (Completed)     Mammo: Last completed on 08/05/2020.  Pap Smear: Last completed on 12/02/2020, results were negative.    Follow-up: Return if symptoms worsen or fail to improve.   I,Gordon Zheng,acting as a Education administrator for [employer], DO.,have documented all relevant documentation on the behalf of Ann Held, DO,as directed by  Ann Held, DO while in the presence of Barry, DO, have reviewed all documentation for this visit. The documentation on 01/19/21 for the exam, diagnosis, procedures, and orders are all accurate and complete.

## 2021-01-19 NOTE — Assessment & Plan Note (Signed)
Sl worse due to upcoming move-- xanax prn rx sent in  F/u prn

## 2021-01-19 NOTE — Assessment & Plan Note (Signed)
Well controlled, no changes to meds. Encouraged heart healthy diet such as the DASH diet and exercise as tolerated.  °

## 2021-01-19 NOTE — Assessment & Plan Note (Signed)
ghm utd Check labs  See avs  

## 2021-01-20 ENCOUNTER — Other Ambulatory Visit: Payer: Self-pay

## 2021-01-20 DIAGNOSIS — Z1211 Encounter for screening for malignant neoplasm of colon: Secondary | ICD-10-CM

## 2021-01-20 LAB — CBC WITH DIFFERENTIAL/PLATELET
Basophils Absolute: 0.1 10*3/uL (ref 0.0–0.1)
Basophils Relative: 1 % (ref 0.0–3.0)
Eosinophils Absolute: 0.2 10*3/uL (ref 0.0–0.7)
Eosinophils Relative: 2.6 % (ref 0.0–5.0)
HCT: 41.6 % (ref 36.0–46.0)
Hemoglobin: 14 g/dL (ref 12.0–15.0)
Lymphocytes Relative: 27.9 % (ref 12.0–46.0)
Lymphs Abs: 2.1 10*3/uL (ref 0.7–4.0)
MCHC: 33.7 g/dL (ref 30.0–36.0)
MCV: 86.4 fl (ref 78.0–100.0)
Monocytes Absolute: 0.7 10*3/uL (ref 0.1–1.0)
Monocytes Relative: 9.3 % (ref 3.0–12.0)
Neutro Abs: 4.4 10*3/uL (ref 1.4–7.7)
Neutrophils Relative %: 59.2 % (ref 43.0–77.0)
Platelets: 288 10*3/uL (ref 150.0–400.0)
RBC: 4.82 Mil/uL (ref 3.87–5.11)
RDW: 12.7 % (ref 11.5–15.5)
WBC: 7.5 10*3/uL (ref 4.0–10.5)

## 2021-01-20 LAB — LIPID PANEL
Cholesterol: 176 mg/dL (ref 0–200)
HDL: 44.5 mg/dL (ref >39.00)
LDL Cholesterol: 99 mg/dL (ref 0–99)
NonHDL: 131.51
Total CHOL/HDL Ratio: 4
Triglycerides: 164 mg/dL — ABNORMAL HIGH (ref 0.0–149.0)
VLDL: 32.8 mg/dL (ref 0.0–40.0)

## 2021-01-20 LAB — COMPREHENSIVE METABOLIC PANEL
ALT: 24 U/L (ref 0–35)
AST: 20 U/L (ref 0–37)
Albumin: 4.1 g/dL (ref 3.5–5.2)
Alkaline Phosphatase: 55 U/L (ref 39–117)
BUN: 11 mg/dL (ref 6–23)
CO2: 22 mEq/L (ref 19–32)
Calcium: 9.1 mg/dL (ref 8.4–10.5)
Chloride: 107 mEq/L (ref 96–112)
Creatinine, Ser: 0.88 mg/dL (ref 0.40–1.20)
GFR: 78.58 mL/min (ref >60.00)
Glucose, Bld: 94 mg/dL (ref 70–99)
Potassium: 3.8 mEq/L (ref 3.5–5.1)
Sodium: 140 mEq/L (ref 135–145)
Total Bilirubin: 0.3 mg/dL (ref 0.2–1.2)
Total Protein: 6.5 g/dL (ref 6.0–8.3)

## 2021-01-20 LAB — TSH: TSH: 1.09 u[IU]/mL (ref 0.35–4.50)

## 2021-01-25 ENCOUNTER — Other Ambulatory Visit: Payer: Self-pay | Admitting: Family Medicine

## 2021-01-25 DIAGNOSIS — I10 Essential (primary) hypertension: Secondary | ICD-10-CM

## 2021-07-24 ENCOUNTER — Other Ambulatory Visit: Payer: Self-pay | Admitting: Family Medicine

## 2021-07-24 DIAGNOSIS — I10 Essential (primary) hypertension: Secondary | ICD-10-CM

## 2021-09-15 ENCOUNTER — Ambulatory Visit: Payer: BC Managed Care – PPO | Admitting: Internal Medicine

## 2021-12-15 ENCOUNTER — Other Ambulatory Visit: Payer: Self-pay | Admitting: Obstetrics & Gynecology

## 2021-12-16 ENCOUNTER — Telehealth: Payer: Self-pay | Admitting: *Deleted

## 2021-12-16 MED ORDER — LO LOESTRIN FE 1 MG-10 MCG / 10 MCG PO TABS: 1.0000 | ORAL_TABLET | Freq: Every day | ORAL | 0 refills | Status: AC

## 2021-12-16 NOTE — Telephone Encounter (Signed)
Patient called has moved out of state back to [place], patient has new GYN appointments scheduled in summer there,however is currently out of BCP. She asked if 3 pack could be sent in until she can see New Gyn provider. 3 pack sent, patient aware. ? ?Last annual exam was 11/2020 ? Will route to [facility] as FYI.   ?

## 2022-01-28 ENCOUNTER — Encounter: Payer: Self-pay | Admitting: *Deleted

## 2022-02-07 ENCOUNTER — Other Ambulatory Visit: Payer: Self-pay | Admitting: Family Medicine

## 2022-02-07 DIAGNOSIS — I10 Essential (primary) hypertension: Secondary | ICD-10-CM

## 2022-02-08 ENCOUNTER — Other Ambulatory Visit: Payer: Self-pay | Admitting: Family Medicine

## 2022-02-08 DIAGNOSIS — I10 Essential (primary) hypertension: Secondary | ICD-10-CM

## 2022-02-23 ENCOUNTER — Other Ambulatory Visit: Payer: Self-pay | Admitting: Family Medicine

## 2022-02-23 DIAGNOSIS — I10 Essential (primary) hypertension: Secondary | ICD-10-CM

## 2023-02-26 ENCOUNTER — Other Ambulatory Visit: Payer: Self-pay | Admitting: Obstetrics & Gynecology
